# Patient Record
Sex: Male | Born: 1970 | Race: Black or African American | Hispanic: No | Marital: Single | Smoking: Never smoker
Health system: Southern US, Community
[De-identification: ages and names within clinical notes are randomized; demographics above are authoritative.]

## PROBLEM LIST (undated history)

## (undated) DIAGNOSIS — L309 Dermatitis, unspecified: Secondary | ICD-10-CM

## (undated) DIAGNOSIS — E559 Vitamin D deficiency, unspecified: Secondary | ICD-10-CM

## (undated) DIAGNOSIS — R7301 Impaired fasting glucose: Secondary | ICD-10-CM

## (undated) HISTORY — DX: Impaired fasting glucose: R73.01

## (undated) HISTORY — DX: Dermatitis, unspecified: L30.9

## (undated) HISTORY — DX: Vitamin D deficiency, unspecified: E55.9

---

## 2017-11-27 ENCOUNTER — Ambulatory Visit: Payer: BC Managed Care – PPO | Admitting: Medical

## 2017-11-27 ENCOUNTER — Encounter: Payer: Self-pay | Admitting: Medical

## 2017-11-27 VITALS — BP 130/80 | HR 80 | Temp 98.2°F | Resp 16 | Ht 68.0 in | Wt 200.0 lb

## 2017-11-27 DIAGNOSIS — Z2821 Immunization not carried out because of patient refusal: Secondary | ICD-10-CM

## 2017-11-27 DIAGNOSIS — G8929 Other chronic pain: Secondary | ICD-10-CM | POA: Diagnosis not present

## 2017-11-27 DIAGNOSIS — M25512 Pain in left shoulder: Secondary | ICD-10-CM | POA: Diagnosis not present

## 2017-11-27 NOTE — Patient Instructions (Addendum)
Shoulder pain  If you get a flareup of the shoulder pain in the near future then use conservative treatment for 5 to 7 days including the following  Using arm sling over-the-counter 1 to 2 hours at a time throughout the day  Resting the arm, not lifting anything over 15 pounds in the short-term  Using over-the-counter Aleve or ibuprofen for 4 to 5 days  Using ice such as ice water pack or bag of frozen peas, 20 minutes twice daily  I would recommend doing a daily stretching routine  I recommend using light weight such as a 5 pound dumbbell or water bottle to do shoulder exercises to strengthen the shoulder  If your pains continue the next step would be an x-ray  Check your insurance coverage about doing a colonoscopy for screening as well as checking for coverage for vaccine  I will see you back at your convenience for physical  At that time we would recommend a tetanus booster if you have not had one in the last 10 years  I also recommend a yearly flu shot    Rotator Cuff Tendinitis Rotator cuff tendinitis is inflammation of the tough, cord-like bands that connect muscle to bone (tendons) in the rotator cuff. The rotator cuff includes all of the muscles and tendons that connect the arm to the shoulder. The rotator cuff holds the head of the upper arm bone (humerus) in the cup (fossa) of the shoulder blade (scapula). This condition can lead to a long-lasting (chronic) tear. The tear may be partial or complete. What are the causes? This condition is usually caused by overusing the rotator cuff. What increases the risk? This condition is more likely to develop in athletes and workers who frequently use their shoulder or reach over their heads. This can include activities such as:  Tennis.  Baseball or softball.  Swimming.  Construction work.  Painting.  What are the signs or symptoms? Symptoms of this condition include:  Pain spreading (radiating) from the shoulder to  the upper arm.  Swelling and tenderness in front of the shoulder.  Pain when reaching, pulling, or lifting the arm above the head.  Pain when lowering the arm from above the head.  Minor pain in the shoulder when resting.  Increased pain in the shoulder at night.  Difficulty placing the arm behind the back.  How is this diagnosed? This condition is diagnosed with a medical history and physical exam. Tests may also be done, including:  X-rays.  MRI.  Ultrasounds.  CT or MR arthrogram. During this test, a contrast material is injected and then images are taken.  How is this treated? Treatment for this condition depends on the severity of the condition. In less severe cases, treatment may include:  Rest. This may be done with a sling that holds the shoulder still (immobilization). Your health care provider may also recommend avoiding activities that involve lifting your arm over your head.  Icing the shoulder.  Anti-inflammatory medicines, such as aspirin or ibuprofen.  In more severe cases, treatment may include:  Physical therapy.  Steroid injections.  Surgery.  Follow these instructions at home: If you have a sling:  Wear the sling as told by your health care provider. Remove it only as told by your health care provider.  Loosen the sling if your fingers tingle, become numb, or turn cold and blue.  Keep the sling clean.  If the sling is not waterproof, do not let it get wet. Remove it,  if allowed, or cover it with a watertight covering when you take a bath or shower. Managing pain, stiffness, and swelling  If directed, put ice on the injured area. ? If you have a removable sling, remove it as told by your health care provider. ? Put ice in a plastic bag. ? Place a towel between your skin and the bag. ? Leave the ice on for 20 minutes, 2-3 times a day.  Move your fingers often to avoid stiffness and to lessen swelling.  Raise (elevate) the injured area  above the level of your heart while you are lying down.  Find a comfortable sleeping position or sleep on a recliner, if available. Driving  Do not drive or use heavy machinery while taking prescription pain medicine.  Ask your health care provider when it is safe to drive if you have a sling on your arm. Activity  Rest your shoulder as told by your health care provider.  Return to your normal activities as told by your health care provider. Ask your health care provider what activities are safe for you.  Do any exercises or stretches as told by your health care provider.  If you do repetitive overhead tasks, take small breaks in between and include stretching exercises as told by your health care provider. General instructions  Do not use any products that contain nicotine or tobacco, such as cigarettes and e-cigarettes. These can delay healing. If you need help quitting, ask your health care provider.  Take over-the-counter and prescription medicines only as told by your health care provider.  Keep all follow-up visits as told by your health care provider. This is important. Contact a health care provider if:  Your pain gets worse.  You have new pain in your arm, hands, or fingers.  Your pain is not relieved with medicine or does not get better after 6 weeks of treatment.  You have cracking sensations when moving your shoulder in certain directions.  You hear a snapping sound after using your shoulder, followed by severe pain and weakness. Get help right away if:  Your arm, hand, or fingers are numb or tingling.  Your arm, hand, or fingers are swollen or painful or they turn white or blue. Summary  Rotator cuff tendinitis is inflammation of the tough, cord-like bands that connect muscle to bone (tendons) in the rotator cuff.  This condition is usually caused by overusing the rotator cuff, which includes all of the muscles and tendons that connect the arm to the  shoulder.  This condition is more likely to develop in athletes and workers who frequently use their shoulder or reach over their heads.  Treatment generally includes rest, anti-inflammatory medicines, and icing. In some cases, physical therapy and steroid injections may be needed. In severe cases, surgery may be needed. This information is not intended to replace advice given to you by your health care provider. Make sure you discuss any questions you have with your health care provider. Document Released: 05/31/2003 Document Revised: 02/25/2016 Document Reviewed: 02/25/2016 Elsevier Interactive Patient Education  2017 [employer].

## 2017-11-27 NOTE — Progress Notes (Signed)
Subjective: Chief Complaint  Patient presents with  . NP left shoulder pain    NP left shoulder pain, arm,    Here as a new patient to establish.   was seeing [facility] in [place].  Been in [place] 2 years.   Here for c/o left shoulder pain.  In summer left shoulder started flaring up more after he started exercising with walking and some weights.  Over time has gotten to be worse in recent weeks with throbbing all the way down left arm.  Was getting to be daily pain until this past week when it improved some.   Left handed.  No neck pain currently, but has had neck pains in 2013, was seeing chiropractor then.   No numbness in hand, but had some numbness in summer time.    He knows he needs to lose some weight  No other aggravating or relieving factors. No other complaint.   No past medical history on file. Current Outpatient Medications on File Prior to Visit  Medication Sig Dispense Refill  . acetaminophen (TYLENOL) 500 MG tablet Take 500 mg by mouth every 6 (six) hours as needed.    Marland Kitchen aspirin EC 81 MG tablet Take 81 mg by mouth daily.    Marland Kitchen ibuprofen (ADVIL,MOTRIN) 200 MG tablet Take 200 mg by mouth every 6 (six) hours as needed.     No current facility-administered medications on file prior to visit.    ROS as in subjective   Objective: BP 130/80   Pulse 80   Temp 98.2 F (36.8 C) (Oral)   Resp 16   Ht 5' 8"  (1.727 m)   Wt 200 lb (90.7 kg)   SpO2 97%   BMI 30.41 kg/m   Gen: wd, wn, ,nad, AA male Skin: unremarkable Neck nontender, no mass, supple, normal range of motion Left shoulder and arm and upper back nontender, normal range of motion of the shoulder but he does seem to have some mild discomfort with shoulder flexion over 90 degrees but no tenderness on palpation and nontender with passive range of motion, no laxity no pain with special test Arms neurovascularly intact Lungs clear RRR, normal S1 and S2 no murmurs   Assessment: Encounter  Diagnoses  Name Primary?  . Chronic left shoulder pain Yes  . Influenza vaccination declined      Plan: We discussed his symptoms and concerns however his shoulder pain resolved in the past week.  No major findings on exam today  Shoulder pain  If you get a flareup of the shoulder pain in the near future then use conservative treatment for 5 to 7 days including the following  Using arm sling over-the-counter 1 to 2 hours at a time throughout the day  Resting the arm, not lifting anything over 15 pounds in the short-term  Using over-the-counter Aleve or ibuprofen for 4 to 5 days  Using ice such as ice water pack or bag of frozen peas, 20 minutes twice daily  I would recommend doing a daily stretching routine  I recommend using light weight such as a 5 pound dumbbell or water bottle to do shoulder exercises to strengthen the shoulder  If your pains continue the next step would be an x-ray  Check your insurance coverage about doing a colonoscopy for screening as well as checking for coverage for vaccine  I will see you back at your convenience for physical  At that time we would recommend a tetanus booster if you have not  had one in the last 10 years  I also recommend a yearly flu shot  He declines a flu shot today  Jacory was seen today for np left shoulder pain.  Diagnoses and all orders for this visit:  Chronic left shoulder pain  Influenza vaccination declined

## 2018-12-24 ENCOUNTER — Ambulatory Visit: Payer: BC Managed Care – PPO | Admitting: Medical

## 2018-12-24 ENCOUNTER — Encounter: Payer: Self-pay | Admitting: Medical

## 2018-12-24 ENCOUNTER — Other Ambulatory Visit: Payer: Self-pay

## 2018-12-24 VITALS — BP 132/88 | HR 88 | Temp 98.0°F | Ht 69.0 in | Wt 214.6 lb

## 2018-12-24 DIAGNOSIS — M79644 Pain in right finger(s): Secondary | ICD-10-CM | POA: Insufficient documentation

## 2018-12-24 DIAGNOSIS — M65351 Trigger finger, right little finger: Secondary | ICD-10-CM

## 2018-12-24 DIAGNOSIS — M67442 Ganglion, left hand: Secondary | ICD-10-CM | POA: Diagnosis not present

## 2018-12-24 DIAGNOSIS — L309 Dermatitis, unspecified: Secondary | ICD-10-CM

## 2018-12-24 DIAGNOSIS — M79645 Pain in left finger(s): Secondary | ICD-10-CM | POA: Diagnosis not present

## 2018-12-24 DIAGNOSIS — Z23 Encounter for immunization: Secondary | ICD-10-CM | POA: Diagnosis not present

## 2018-12-24 MED ORDER — EUCRISA 2 % EX OINT: 1.0000 "application " | TOPICAL_OINTMENT | Freq: Every day | CUTANEOUS | 2 refills | Status: DC

## 2018-12-24 NOTE — Progress Notes (Signed)
Subjective:  Derek Cowan is a 48 y.o. male who presents for Chief Complaint  Patient presents with  . Hand Pain    biltaeral-right hand pinky left hand middle finger-middle fingreer has bump and pain radiates up left arm      Here for some pains in fingers.  He notes bump on left middle finger been there since January, but if he bumps the bump, has pain.  No change in ROM of left middle finger.   He also notes he can't fully bend right pinky and there is some pain with flexion.   This has been ongoing for weeks.  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  Mostly gets irritated skin, rough skin patches on face and arms.  No current flare up.  No other aggravating or relieving factors.    No other c/o.  The following portions of the patient's history were reviewed and updated as appropriate: allergies, current medications, past family history, past medical history, past social history, past surgical history and problem list.  ROS Otherwise as in subjective above  Objective: BP 132/88   Pulse 88   Temp 98 F (36.7 C)   Ht 5' 9"  (1.753 m)   Wt 214 lb 9.6 oz (97.3 kg)   SpO2 98%   BMI 31.69 kg/m   General appearance: alert, no distress, well developed, well nourished No current skin irritation or abnormality of face or arms Left 3rd finger mid phalanx with raised somewhat oblong mobile cystic mass subcutaneous that causes some discomfort with pressure and manipulation, otherwise finger normal appearing, otherwise nontender and normal ROM.    Right 5 finger nontender other than mildly over 5th MCP volar side distally, unable to completely flex the 5th finger, otherwise hands and arms normal exam.  Arms neurovascularly intact Pulses: 2+ radial pulses, 2+ pedal pulses, normal cap refill Ext: no edema   Assessment: Encounter Diagnoses  Name Primary?  . Pain in finger of both hands Yes  . Ganglion cyst of finger of left hand   . Trigger little  finger of right hand   . Need for influenza vaccination   . Eczema, unspecified type      Plan: We discussed his symptoms and concerns and exam findings.  We discussed differential diagnosis.  I suspect his left middle finger lesion is either epidermal inclusion cyst or ganglion cyst of the finger.  We discussed his right small finger symptoms which may be mild trigger finger issue/nodule pushing on flexor tendon.  We will refer to orthopedics for further eval and treatment options.  He is bothered by both issues regularly, plays piano and would like some treatment options for these.  Counseled on the influenza virus vaccine.  Vaccine information sheet given.  Influenza vaccine given after consent obtained.  Eczema - continue Eucrisa daily, avoid allergy triggers if possible   Derek Cowan was seen today for hand pain.  Diagnoses and all orders for this visit:  Pain in finger of both hands -     Ambulatory referral to Orthopedic Surgery  Ganglion cyst of finger of left hand -     Ambulatory referral to Orthopedic Surgery  Trigger little finger of right hand -     Ambulatory referral to Orthopedic Surgery  Need for influenza vaccination  Eczema, unspecified type  Other orders -     Crisaborole (EUCRISA) 2 % OINT; Apply 1 application topically daily.    Follow up: pending referral

## 2018-12-25 ENCOUNTER — Telehealth: Payer: Self-pay | Admitting: Medical

## 2018-12-25 NOTE — Telephone Encounter (Signed)
Refer to either ortho or hand surgery center   RE: Left finger either epidermal inclusion cyst vs ganglion cyst of finger Right hand possible trigger finger

## 2018-12-26 ENCOUNTER — Telehealth: Payer: Self-pay | Admitting: Medical

## 2018-12-26 NOTE — Telephone Encounter (Signed)
P.A. EUCRISA

## 2018-12-27 NOTE — Telephone Encounter (Signed)
Patient has been referred.

## 2018-12-30 ENCOUNTER — Encounter: Payer: Self-pay | Admitting: Family Medicine

## 2018-12-30 ENCOUNTER — Other Ambulatory Visit: Payer: Self-pay

## 2018-12-30 ENCOUNTER — Ambulatory Visit (INDEPENDENT_AMBULATORY_CARE_PROVIDER_SITE_OTHER): Payer: BC Managed Care – PPO | Admitting: Family Medicine

## 2018-12-30 ENCOUNTER — Ambulatory Visit: Payer: Self-pay

## 2018-12-30 DIAGNOSIS — R2232 Localized swelling, mass and lump, left upper limb: Secondary | ICD-10-CM | POA: Diagnosis not present

## 2018-12-30 DIAGNOSIS — M65351 Trigger finger, right little finger: Secondary | ICD-10-CM | POA: Diagnosis not present

## 2018-12-30 NOTE — Progress Notes (Signed)
Office Visit Note   Patient: Derek Cowan           Date of Birth: Jul 06, 1970           MRN: [id number] Visit Date: 12/30/2018 Requested by: Carlena Hurl, PA-C [address] [place],  [place] [address] PCP: Carlena Hurl, PA-C  Subjective: Chief Complaint  Patient presents with  . Right Little Finger - Follow-up  . Left Middle Finger - Pain  . Right Hand - Pain  . Left Hand - Pain    HPI: He is here with right fifth finger and left third finger pain.  His left third finger developed a nodule around February of this year.  He does not recall a specific injury but he noticed a bump on the dorsal/ulnar side and it has not improved with time.  In fact, sometimes he feels pain shooting from it up the arm toward the elbow.  It does not affect his ability to bend his finger.  It is uncomfortable when something bumps against it.  In the past 3 or 4 weeks he has had triggering of his right fifth finger.  Again, no injury.  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                ROS: No fevers or chills.  No diabetes, thyroid dysfunction, etc.  All other systems were reviewed and are negative.  Objective: Vital Signs: There were no vitals taken for this visit.  Physical Exam:  General:  Alert and oriented, in no acute distress. Pulm:  Breathing unlabored. Psy:  Normal mood, congruent affect. Skin: No erythema or rash. Right hand: His fifth finger has a tender nodule at A1 pulley.  He has full active and passive range of motion of the finger, no triggering this morning.  No effusion in the PIP joint, no tenderness to palpation around the PIP joint. Left hand: His third finger has a nodule on the dorsal ulnar side just distal to the DIP joint.  It is roughly 1/2 cm diameter, it moves freely and is not adhered to the bone but it is slightly firm to the touch.   Imaging: Musculoskeletal  ultrasound of fingers: His left third finger nodule is not completely hypoechoic, in fact it is mixed echogenicity.  There is slight increased flow with power Doppler imaging.  It does not seem to be arising from the joint.  Is right fifth finger A1 pulley is slightly thickened.  The flexor tendon appears normal as does the PIP joint of the finger.   Assessment & Plan: 1.  Right fifth trigger finger -Discussed various options with patient and he would like to try an injection.  If this does not help, then hand therapy with iontophoresis.  2.  Left third finger nodule -Etiology is uncertain.  It does not look exactly like a ganglion cyst, seems to be more of a solid nodule.  We will order an MRI scan to further evaluate.  If MRI shows a ganglion cyst, then we could aspirate and inject.     Procedures: Right fifth trigger finger injection: After sterile prep Betadine, injected 1/2 cc 1% lidocaine without epinephrine and 20 mg methylprednisolone into the region of the A1 pulley.    PMFS History: Patient Active Problem List   Diagnosis Date Noted  . Pain in finger of both hands 12/24/2018  . Ganglion cyst of  finger of left hand 12/24/2018  . Trigger little finger of right hand 12/24/2018  . Need for influenza vaccination 12/24/2018  . Eczema 12/24/2018  . Chronic left shoulder pain 11/27/2017  . Influenza vaccination declined 11/27/2017   History reviewed. No pertinent past medical history.  History reviewed. No pertinent family history.  History reviewed. No pertinent surgical history. Social History   Occupational History  . Not on file  Tobacco Use  . Smoking status: Never Smoker  . Smokeless tobacco: Never Used  Substance and Sexual Activity  . Alcohol use: Not on file  . Drug use: Not on file  . Sexual activity: Not on file

## 2018-12-30 NOTE — Progress Notes (Signed)
Right pinky finger trigger finger, left middle finger-possible ganglion. Bilateral hand pain-?CTS?

## 2019-01-08 NOTE — Telephone Encounter (Signed)
P.A. approved til 12/26/19, pt informed, called pharmacy

## 2019-01-27 ENCOUNTER — Other Ambulatory Visit: Payer: Self-pay

## 2019-01-27 ENCOUNTER — Ambulatory Visit
Admission: RE | Admit: 2019-01-27 | Discharge: 2019-01-27 | Disposition: A | Discharge status: Home / Self Care | Payer: BC Managed Care – PPO | Source: Ambulatory Visit | Attending: Family Medicine | Admitting: Family Medicine

## 2019-01-27 DIAGNOSIS — R2232 Localized swelling, mass and lump, left upper limb: Secondary | ICD-10-CM

## 2019-01-27 MED ORDER — GADOBENATE DIMEGLUMINE 529 MG/ML IV SOLN
20.0000 mL | Freq: Once | INTRAVENOUS | Status: AC | PRN
  Administered 2019-01-27: 20 mL via INTRAVENOUS

## 2019-01-28 ENCOUNTER — Telehealth: Payer: Self-pay | Admitting: Family Medicine

## 2019-01-28 DIAGNOSIS — R2232 Localized swelling, mass and lump, left upper limb: Secondary | ICD-10-CM

## 2019-01-28 NOTE — Telephone Encounter (Signed)
Finger MRI shows a solid mass.  Not sure exactly what it is, so I would like him to see Dr. Amedeo Plenty for further evaluation.  Will request referral.

## 2019-01-28 NOTE — Telephone Encounter (Signed)
Pt called in returning dr.hilts call please give him a call back when you can   [phone]

## 2019-01-28 NOTE — Telephone Encounter (Signed)
Patient has returned your call - asking for a call back.

## 2019-01-31 NOTE — Telephone Encounter (Signed)
I spoke with the patient, advising him of his MRI results and referral to Dr. Amedeo Plenty. He will await phone call from [facility] regarding an appointment.

## 2019-03-25 HISTORY — PX: HAND SURGERY: SHX662

## 2019-09-28 ENCOUNTER — Ambulatory Visit: Payer: BC Managed Care – PPO | Admitting: Medical

## 2019-09-28 ENCOUNTER — Other Ambulatory Visit: Payer: Self-pay

## 2019-09-28 ENCOUNTER — Encounter: Payer: Self-pay | Admitting: Medical

## 2019-09-28 VITALS — BP 136/80 | HR 80 | Ht 69.0 in | Wt 206.6 lb

## 2019-09-28 DIAGNOSIS — R002 Palpitations: Secondary | ICD-10-CM

## 2019-09-28 DIAGNOSIS — R0789 Other chest pain: Secondary | ICD-10-CM

## 2019-09-28 DIAGNOSIS — R519 Headache, unspecified: Secondary | ICD-10-CM | POA: Diagnosis not present

## 2019-09-28 DIAGNOSIS — Z6830 Body mass index (BMI) 30.0-30.9, adult: Secondary | ICD-10-CM

## 2019-09-28 DIAGNOSIS — M79602 Pain in left arm: Secondary | ICD-10-CM | POA: Diagnosis not present

## 2019-09-28 DIAGNOSIS — G8929 Other chronic pain: Secondary | ICD-10-CM | POA: Insufficient documentation

## 2019-09-28 DIAGNOSIS — R0683 Snoring: Secondary | ICD-10-CM

## 2019-09-28 NOTE — Progress Notes (Signed)
Subjective: Chief Complaint  Patient presents with  . Chest Pain    left arm pain last night-denies pain today    Here for left arm pain, chest discomfort.  He gets an unusual twinge of pain in his left upper arm occasionally.  No pain with activity, no decreased range of motion, no swelling.  No recent fall injury or trauma.  Gets this from time to time.  1 thing that brought him in today was that yesterday he had some palpitations in the chest and a chest discomfort.  He does not have associated nausea, sweats, SOB, dizziness, or jaw or shoulder pain.  He denies heavy caffeine use.  He does note some stress though.  These pains in his chest lasted for hours yesterday up until 3 AM.  He ended up using a combination of Pepto-Bismol, Prilosec and apple cider vinegar and the pain finally resolved early in the morning yesterday  He denies eating heavily acidic or spicy foods recently.  He felt like this pain was different than acid reflux symptoms he has had in the past  He lives alone.  He notes a long history of snoring but no witnessed apnea.  Sometimes has fatigue, sometimes daytime somnolence.   His father is a very loud snore and he thinks his father has sleep apnea but he has never had a study  He notes chronic headaches since her teenage years.  Sometimes gets nauseous, sometimes certain smells can trigger the headaches.  No formal diagnosis.  He uses over-the-counter Excedrin fairly often.  Headaches are several times per week chronically  His blood pressure is elevated but no prior history of hypertension.  No prior medication for blood pressure.    No family history of heart disease or sleep apnea.  Sister and aunt on bp meds   No past medical history on file.  Current Outpatient Medications on File Prior to Visit  Medication Sig Dispense Refill  . acetaminophen (TYLENOL) 500 MG tablet Take 500 mg by mouth every 6 (six) hours as needed. (Patient not taking: Reported on 09/28/2019)     . aspirin EC 81 MG tablet Take 81 mg by mouth daily. (Patient not taking: Reported on 09/28/2019)    . Crisaborole (EUCRISA) 2 % OINT Apply 1 application topically daily. (Patient not taking: Reported on 09/28/2019) 60 g 2  . ibuprofen (ADVIL,MOTRIN) 200 MG tablet Take 200 mg by mouth every 6 (six) hours as needed. (Patient not taking: Reported on 09/28/2019)    . ranitidine (ZANTAC) 150 MG tablet ranitidine 150 mg tablet  Take 1 tablet twice a day by oral route for 30 days. (Patient not taking: Reported on 09/28/2019)     No current facility-administered medications on file prior to visit.    No family history on file.   ROS as in subjective   Objective: BP 136/80   Pulse 80   Ht 5' 9"  (1.753 m)   Wt 206 lb 9.6 oz (93.7 kg)   SpO2 96%   BMI 30.51 kg/m   BP: 136/80  BP Readings from Last 3 Encounters:  09/28/19 136/80  12/24/18 132/88  11/27/17 130/80     General appearence: alert, no distress, WD/WN, African-American male HEENT: normocephalic, sclerae anicteric, PERRLA, EOMi, nares patent, no discharge or erythema, pharynx normal neck: supple, no lymphadenopathy, no thyromegaly, no masses Heart: RRR, normal S1, S2, no murmurs Lungs: CTA bilaterally, no wheezes, rhonchi, or rales Abdomen: +bs, soft, non tender, non distended, no masses, no hepatomegaly, no  splenomegaly Back: non tender Musculoskeletal: nontender, no swelling, no obvious deformity Extremities: no edema, no cyanosis, no clubbing Pulses: 2+ symmetric, upper and lower extremities, normal cap refill Neurological: alert, oriented x 3, CN2-12 intact, strength normal upper extremities and lower extremities, sensation normal throughout, DTRs 2+ throughout, no cerebellar signs, gait normal Psychiatric: normal affect, behavior normal, pleasant   EKG: Indication chest discomfort Rate 73 bpm, PR 144 ms, QRS 80 ms, QTC 420 ms, axis 60 degrees normal sinus rhythm, there was significant baseline interference.  We had to  repeat EKG several times.  The battery had run out and we had reviewed EKG machine.  After the EKG was reviewed and powered up some of the baseline artifact resolved.  So I do not completely trust the results of the EKG but no obvious ST elevation or acute change there may be some atrial enlargement given the P wave   Assessment: Encounter Diagnoses  Name Primary?  . Chest discomfort Yes  . Palpitation   . Left arm pain   . Chronic nonintractable headache, unspecified headache type   . Snoring   . BMI 30.0-30.9,adult      Plan: Chest discomfort, palpitations-we reviewed his EKG.  I believe there was some EKG baseline interference with our machine today.   No obvious worrisome findings.  Unclear etiology but possibly GERD, possibly stress.  He will return for fasting physical and labs in the near future  He does snore and has some symptoms of sleep apnea.  I encouraged him to do a sleep study.  He will let me know if agreeable  We discussed not sleeping flat supine, consider raising head of bed, work on efforts to lose weight through healthy diet and exercise  Left arm pain, intermittent, normal exam today  Chronic headaches, snoring, BMI greater than 30, consider sleep apnea diagnosis.  Again he will return soon for fasting labs, physical, and other evaluation for chronic headaches   Kannen was seen today for chest pain.  Diagnoses and all orders for this visit:  Chest discomfort -     EKG 12-Lead  Palpitation -     EKG 12-Lead  Left arm pain  Chronic nonintractable headache, unspecified headache type  Snoring  BMI 30.0-30.9,adult  f/u soon for fasting physical

## 2019-09-29 ENCOUNTER — Encounter: Payer: Self-pay | Admitting: Medical

## 2019-10-20 ENCOUNTER — Encounter: Payer: Self-pay | Admitting: Medical

## 2019-10-27 ENCOUNTER — Encounter: Payer: BC Managed Care – PPO | Admitting: Medical

## 2019-11-18 ENCOUNTER — Ambulatory Visit: Payer: BC Managed Care – PPO | Admitting: Medical

## 2019-11-18 ENCOUNTER — Encounter: Payer: Self-pay | Admitting: Medical

## 2019-11-18 ENCOUNTER — Other Ambulatory Visit: Payer: Self-pay

## 2019-11-18 VITALS — BP 124/86 | HR 93 | Ht 69.0 in | Wt 204.0 lb

## 2019-11-18 DIAGNOSIS — Z125 Encounter for screening for malignant neoplasm of prostate: Secondary | ICD-10-CM

## 2019-11-18 DIAGNOSIS — Z0000 Encounter for general adult medical examination without abnormal findings: Secondary | ICD-10-CM | POA: Diagnosis not present

## 2019-11-18 DIAGNOSIS — Z113 Encounter for screening for infections with a predominantly sexual mode of transmission: Secondary | ICD-10-CM

## 2019-11-18 DIAGNOSIS — Z23 Encounter for immunization: Secondary | ICD-10-CM | POA: Diagnosis not present

## 2019-11-18 DIAGNOSIS — Z1211 Encounter for screening for malignant neoplasm of colon: Secondary | ICD-10-CM | POA: Insufficient documentation

## 2019-11-18 DIAGNOSIS — Z13220 Encounter for screening for lipoid disorders: Secondary | ICD-10-CM | POA: Diagnosis not present

## 2019-11-18 NOTE — Patient Instructions (Signed)
Preventative Care for Adults - Male    Thank you for coming in for your well visit today, and thank you for trusting [place] with your care!  If you had a good experience today, please complete the surveys sent by [facility] and consider a review online such as Google or [employer] Grades, refer [place] to a friend   Maintain regular health and wellness exams:  A routine yearly physical is a good way to check in with your primary care provider about your health and preventive screening. It is also an opportunity to share updates about your health and any concerns you have, and receive a thorough all-over exam.   Most health insurance companies pay for at least some preventative services.  Check with your health plan for specific coverages.  What preventative services do men need?  Adult men should have their weight and blood pressure checked regularly.   Men age 23 and older should have their cholesterol levels checked regularly.  Beginning at age 23 and continuing to age 9, men should be screened for colorectal cancer.  Certain people may need continued testing until age 15.  Updating vaccinations is part of preventative care.  Vaccinations help protect against diseases such as the flu.  Osteoporosis is a disease in which the bones lose minerals and strength as we age. Men ages 47 and over should discuss this with their caregivers  Lab tests are generally done as part of preventative care to screen for anemia and blood disorders, to screen for problems with the kidneys and liver, to screen for bladder problems, to check blood sugar, and to check your cholesterol level.  Preventative services generally include counseling about diet, exercise, avoiding tobacco, drugs, excessive alcohol consumption, and sexually transmitted infections.   Xrays and CT scans are not normally done as a preventative test, and most insurances do not pay for imaging for screening other than as discussed under cancer screens  below.   On the other hand, if you have certain medical concerns, imaging may be necessary as a diagnostic test.    Your Medical Team Your medical team starts with [place], your PCP or primary care provider.  Please use our services for your routine care such as physicals, screenings, immunizations, sick visits, and your first stop for general medical concerns.  You can call our number for after hours information for urgent questions that may need attention but cannot wait til the next business day.    Urgent care-urgent cares exist to provide care when your primary care office would typically be closed such as evenings or weekends.   Urgent care is for evaluation of urgent medical problems that do not necessarily require emergency department care, but cannot wait til the next business day when we are open.  Emergency department care-please reserve emergency department care for serious, urgent, possibly life-threatening medical problems.  This includes issues like possible stroke, heart attack, significant injury, mental health crisis, or other urgent need that requires immediate medical attention.     See your dentist office twice yearly for hygiene and cleaning visits.   Brush your teeth and floss your teeth daily.  See your eye doctor yearly for routine eye exam and screenings for glaucoma and retinal disease.    Vaccines:  Stay up to date with your tetanus shots and other required immunizations. You should have a booster for tetanus every 10 years. Be sure to get your flu shot every year, since 5%-20% of the [place] population comes down  with the flu. The flu vaccine changes each year, so being vaccinated once is not enough. Get your shot in the fall, before the flu season peaks.   Other vaccines to consider:  Pneumococcal vaccine to protect against certain types of pneumonia.  This is normally recommended for adults age 47 or older.  However, adults younger than 47 years old with certain  underlying conditions such as diabetes, heart or lung disease should also receive the vaccine.  Shingles vaccine to protect against Varicella Zoster if you are older than age 47, or younger than 47 years old with certain underlying illness.  If you have not had the Shingrix vaccine, please call your insurer to inquire about coverage for the Shingrix vaccine given in 2 doses.   Some insurers cover this vaccine after age 58, some cover this after age 47.  If your insurer covers this, then call to schedule appointment to have this vaccine here  Hepatitis A vaccine to protect against a form of infection of the liver by a virus acquired from food.  Hepatitis B vaccine to protect against a form of infection of the liver by a virus acquired from blood or body fluids, particularly if you work in health care.  If you plan to travel internationally, check with your local health department for specific vaccination recommendations.  Human Papilloma Virus or HPV causes cancer of the cervix, and other infections that can be transmitted from person to person. There is a vaccine for HPV, and males should get immunized between the ages of 47 and 66. It requires a series of 3 shots.   Covid/Coronavirus - Please consider vaccination for your benefit and to help prevent spread of Covid to those around you.       What should I know about Cancer screening? Many types of cancers can be detected early and may often be prevented. Lung Cancer  You should be screened every year for lung cancer if: ? You are a current smoker who has smoked for at least 30 years. ? You are a former smoker who has quit within the past 15 years.  Talk to your health care provider about your screening options, when you should start screening, and how often you should be screened.  Colorectal Cancer  Routine colorectal cancer screening usually begins at 47 years of age and should be repeated every 5-10 years until you are 47 years old.  You may need to be screened more often if early forms of precancerous polyps or small growths are found. Your health care provider may recommend screening at an earlier age if you have risk factors for colon cancer.  Your health care provider may recommend using home test kits to check for hidden blood in the stool.  A small camera at the end of a tube can be used to examine your colon (sigmoidoscopy or colonoscopy). This checks for the earliest forms of colorectal cancer.  Prostate and Testicular Cancer  Depending on your age and overall health, your health care provider may do certain tests to screen for prostate and testicular cancer.  Talk to your health care provider about any symptoms or concerns you have about testicular or prostate cancer.  Skin Cancer  Check your skin from head to toe regularly.  Tell your health care provider about any new moles or changes in moles, especially if: ? There is a change in a mole's size, shape, or color. ? You have a mole that is larger than a pencil eraser.  Always use sunscreen. Apply sunscreen liberally and repeat throughout the day.  Protect yourself by wearing long sleeves, pants, a wide-brimmed hat, and sunglasses when outside.     GENERAL RECOMMENDATIONS FOR GOOD HEALTH:  Healthy diet:  Eat a variety of foods, including fruit, vegetables, animal or vegetable protein, such as meat, fish, chicken, and eggs, or beans, lentils, tofu, and grains, such as rice.  Drink plenty of water daily.  Decrease saturated fat in the diet, avoid lots of red meat, processed foods, sweets, fast foods, and fried foods.  Exercise:  Aerobic exercise helps maintain good heart health. At least 30-40 minutes of moderate-intensity exercise is recommended. For example, a brisk walk that increases your heart rate and breathing. This should be done on most days of the week.   Find a type of exercise or a variety of exercises that you enjoy so that it becomes a  part of your daily life.  Examples are running, walking, swimming, water aerobics, and biking.  For motivation and support, explore group exercise such as aerobic class, spin class, Zumba, Yoga,or  martial arts, etc.    Set exercise goals for yourself, such as a certain weight goal, walk or run in a race such as a 5k walk/run.  Speak to your primary care provider about exercise goals.  Your weight readings per our records: Wt Readings from Last 3 Encounters:  11/18/19 204 lb (92.5 kg)  09/28/19 206 lb 9.6 oz (93.7 kg)  12/24/18 214 lb 9.6 oz (97.3 kg)    Body mass index is 30.13 kg/m.    Disease prevention:  If you smoke or chew tobacco, find out from your caregiver how to quit. It can literally save your life, no matter how long you have been a tobacco user. If you do not use tobacco, never begin.   Maintain a healthy diet and normal weight. Increased weight leads to problems with blood pressure and diabetes.   The Body Mass Index or BMI is a way of measuring how much of your body is fat. Having a BMI above 27 increases the risk of heart disease, diabetes, hypertension, stroke and other problems related to obesity. Your caregiver can help determine your BMI and based on it develop an exercise and dietary program to help you achieve or maintain this important measurement at a healthful level.  High blood pressure causes heart and blood vessel problems.  Persistent high blood pressure should be treated with medicine if weight loss and exercise do not work.  Your blood pressure readings per our records:     BP Readings from Last 3 Encounters:  11/18/19 124/86  09/28/19 136/80  12/24/18 132/88     Fat and cholesterol leaves deposits in your arteries that can block them. This causes heart disease and vessel disease elsewhere in your body.  If your cholesterol is found to be high, or if you have heart disease or certain other medical conditions, then you may need to have your cholesterol  monitored frequently and be treated with medication.   Ask if you should have a cardiac stress test if your history suggests this. A stress test is a test done on a treadmill that looks for heart disease. This test can find disease prior to there being a problem.   Osteoporosis is a disease in which the bones lose minerals and strength as we age. This can result in serious bone fractures. Risk of osteoporosis can be identified using a bone density scan. Men ages 41  and over should discuss this with their caregivers. Ask your caregiver whether you should be taking a calcium supplement and Vitamin D, to reduce the rate of osteoporosis.   Avoid drinking alcohol in excess (more than two drinks per day).  Avoid use of street drugs. Do not share needles with anyone. Ask for professional help if you need assistance or instructions on stopping the use of alcohol, cigarettes, and/or drugs.  Brush your teeth twice a day with fluoride toothpaste, and floss once a day. Good oral hygiene prevents tooth decay and gum disease. The problems can be painful, unattractive, and can cause other health problems. Visit your dentist for a routine oral and dental check up and preventive care every 6-12 months.      Spiritual and Emotional Health Keeping a healthy spiritual life can help you better manage your physical health. Your spiritual life can help you to cope with any issues that may arise with your physical health.  Balance can keep [place] healthy and help [place] to recover.  If you are struggling with your spiritual health there are questions that you may want to ask yourself:  What makes me feel most complete? When do I feel most connected to the rest of the world? Where do I find the most inner strength? What am I doing when I feel whole?  Helpful tips: . Being in nature. Some people feel very connected and at peace when they are walking outdoors or are outside. Marland Kitchen Helping others. Some feel the largest sense of  wellbeing when they are of service to others. Being of service can take on many forms. It can be doing volunteer work, being kind to strangers, or offering a hand to a friend in need. . Gratitude. Some people find they feel the most connected when they remain grateful. They may make lists of all the things they are grateful for or say a thank you out loud for all they have.    Emotional Health Are you in tune with your emotional health?  Check out this link: http://www.bray.com/    Legal  Take the time to do a last will and testament, Advanced Directives including [place] and Living Will documents.  Don't leave your family with burdens that can be handled ahead of time.   Financial Health . Make sure you use a budget for your personal finances . Make sure you are insured against risks (health insurance, life insurance, auto insurance, etc) . Save more, spend less . Set financial goals . If you need help in this area, good resources include counseling through [employer] or other community resources, have a meeting with a Emergency planning/management officer, and a good resource is [employer] 10 reasons people come to the doctor's office:   (what is your "ounce of prevention")  Skin disorders; Osteoarthritis and joint disorders; Back problems; Cholesterol problems; Upper respiratory conditions, excluding asthma; Anxiety, depression, and bipolar disorder; Chronic neurologic disorders; High blood pressure; Headaches and migraines; and Diabetes.      Safety:  Use seatbelts 100% of the time, whether driving or as a passenger.  Use safety devices such as hearing protection if you work in environments with loud noise or significant background noise.  Use safety glasses when doing any work that could send debris in to the eyes.  Use a helmet if you ride a bike or motorcycle.  Use appropriate safety gear for contact sports.  Talk to  your  caregiver about gun safety.  Use sunscreen with a SPF (or skin protection factor) of 15 or greater.  Lighter skinned people are at a greater risk of skin cancer. Don't forget to also wear sunglasses in order to protect your eyes from too much damaging sunlight. Damaging sunlight can accelerate cataract formation.   Keep carbon monoxide and smoke detectors in your home functioning at all times. Change the batteries every 6 months or use a model that plugs into the wall.    Sexual activity: . Sex is a normal part of life and sexual activity can continue into older adulthood for many healthy people.   . If you are having erectile dysfunction issues, please follow up to discuss this further.   . If you are not in a monogamous relationship or have more than one partner, please practice safe sex.  Use condoms. Condoms are used for birth control and to help reduce the spread of sexually transmitted infections (or STIs).  Some of the STIs are gonorrhea (the clap), chlamydia, syphilis, trichomonas, herpes, HPV (human papilloma virus) and HIV (human immunodeficiency virus) which causes AIDS. The herpes, HIV and HPV are viral illnesses that have no cure. These can result in disability, cancer and death.   We are able to test for STIs here at our office.

## 2019-11-18 NOTE — Progress Notes (Signed)
Subjective:   HPI  Derek Cowan is a 49 y.o. male who presents for Chief Complaint  Patient presents with  . 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  Plans to establish with eye doctor  Reviewed their medical, surgical, family, social, medication, and allergy history and updated chart as appropriate.  Past Medical History:  Diagnosis Date  . Eczema     Past Surgical History:  Procedure Laterality Date  . HAND SURGERY  2021   tumor of left hand, benign    Family History  Problem Relation Age of Onset  . Lupus Mother   . Lung disease Mother        ARDS  . Hypertension Mother   . Prostatitis Father   . Arrhythmia Sister   . Miscarriages / Stillbirths Sister   . Hypertension Sister   . Heart disease Paternal Aunt   . Cancer Paternal Grandfather        lung  . Stroke Neg Hx     No current outpatient medications on file.  Allergies  Allergen Reactions  . Lactose Intolerance (Gi)     Review of Systems Constitutional: -fever, -chills, -sweats, -unexpected weight change, -decreased appetite, -fatigue Allergy: -sneezing, -itching, -congestion Dermatology: -changing moles, --rash, -lumps ENT: -runny nose, -ear pain, -sore throat, -hoarseness, -sinus pain, -teeth pain, - ringing in ears, -hearing loss, -nosebleeds Cardiology: -chest pain, -palpitations, -swelling, -difficulty breathing when lying flat, -waking up short of breath Respiratory: -cough, -shortness of breath, -difficulty breathing with exercise or exertion, -wheezing, -coughing up blood Gastroenterology: -abdominal pain, -nausea, -vomiting, -diarrhea, -constipation, -blood in stool, -changes in bowel movement, -difficulty swallowing or eating Hematology: -bleeding, -bruising  Musculoskeletal: -joint aches, -muscle aches, -joint swelling,  -back pain, -neck pain, -cramping, -changes in gait Ophthalmology: denies vision changes, eye redness, itching, discharge Urology: -burning with urination, -difficulty urinating, -blood in urine, -urinary frequency, -urgency, -incontinence Neurology: -headache, -weakness, -tingling, -numbness, -memory loss, -falls, -dizziness Psychology: -depressed mood, -agitation, -sleep problems Male GU: no testicular mass, pain, no lymph nodes swollen, no swelling, no rash.     Objective:  BP 124/86   Pulse 93   Ht 5' 9"  (1.753 m)   Wt 204 lb (92.5 kg)   SpO2 97%   BMI 30.13 kg/m   General appearance: alert, no distress, WD/WN, African American male Skin: unremarkable Neck: supple, no lymphadenopathy, no thyromegaly, no masses, normal ROM, no bruits Chest: non tender, normal shape and expansion Heart: RRR, normal S1, S2, no murmurs Lungs: CTA bilaterally, no wheezes, rhonchi, or rales Abdomen: +bs, soft, non tender, non distended, no masses, no hepatomegaly, no splenomegaly, no bruits Back: non tender, normal ROM, no scoliosis Musculoskeletal: upper extremities non tender, no obvious deformity, normal ROM throughout, lower extremities non tender, no obvious deformity, normal ROM throughout Extremities: no edema, no cyanosis, no clubbing Pulses: 2+ symmetric, upper and lower extremities, normal cap refill Neurological: alert, oriented x 3, CN2-12 intact, strength normal upper extremities and lower extremities, sensation normal throughout, DTRs 2+ throughout, no cerebellar signs, gait normal Psychiatric: normal affect, behavior normal, pleasant  GU: normal male external genitalia,circumcised, nontender, no masses, no hernia, no lymphadenopathy Rectal: anus normal tone, prostate mildly enlarged , no nodules   Assessment and Plan :   Encounter Diagnoses  Name Primary?  . Encounter for health maintenance examination in  adult Yes  . Need for Tdap vaccination   . Need for influenza vaccination    . Screening for lipid disorders   . Screening for prostate cancer   . Screen for colon cancer   . Screen for STD (sexually transmitted disease)     Physical exam - discussed and counseled on healthy lifestyle, diet, exercise, preventative care, vaccinations, sick and well care, proper use of emergency dept and after hours care, and addressed their concerns.    Health screening: See your eye doctor yearly for routine vision care. See your dentist yearly for routine dental care including hygiene visits twice yearly.  Discussed STD testing, discussed prevention, condom use, means of transmission  Cancer screening Colonoscopy:  Advised he check insurance for coverage for colonoscopy screening  Discussed PSA, prostate exam, and prostate cancer screening risks/benefits.      Vaccinations: Counseled on the influenza virus vaccine.  Vaccine information sheet given.  Influenza vaccine given after consent obtained.  Counseled on the Tdap (tetanus, diptheria, and acellular pertussis) vaccine.  Vaccine information sheet given. Tdap vaccine given after consent obtained.  He notes covid vaccine series in spring     Derek Cowan was seen today for annual exam.  Diagnoses and all orders for this visit:  Encounter for health maintenance examination in adult -     Comprehensive metabolic panel -     CBC with Differential/Platelet -     PSA -     Lipid panel -     HIV Antibody (routine testing w rflx) -     RPR -     GC/Chlamydia Probe Amp -     Hepatitis C antibody -     Hepatitis B surface antigen  Need for Tdap vaccination  Need for influenza vaccination  Screening for lipid disorders -     Lipid panel  Screening for prostate cancer -     PSA  Screen for colon cancer  Screen for STD (sexually transmitted disease) -     HIV Antibody (routine testing w rflx) -     RPR -     GC/Chlamydia Probe Amp -     Hepatitis C antibody -     Hepatitis B surface antigen  Other orders -      Flu Vaccine QUAD 6+ mos PF IM (Fluarix Quad PF) -     Tdap vaccine greater than or equal to 7yo IM    Follow-up pending labs, yearly for physical

## 2019-11-19 LAB — COMPREHENSIVE METABOLIC PANEL
ALT: 14 IU/L (ref 0–44)
AST: 15 IU/L (ref 0–40)
Albumin/Globulin Ratio: 1.6 (ref 1.2–2.2)
Albumin: 4.9 g/dL (ref 4.0–5.0)
Alkaline Phosphatase: 133 IU/L — ABNORMAL HIGH (ref 48–121)
BUN/Creatinine Ratio: 10 (ref 9–20)
BUN: 14 mg/dL (ref 6–24)
Bilirubin Total: 0.8 mg/dL (ref 0.0–1.2)
CO2: 24 mmol/L (ref 20–29)
Calcium: 10 mg/dL (ref 8.7–10.2)
Chloride: 99 mmol/L (ref 96–106)
Creatinine, Ser: 1.39 mg/dL — ABNORMAL HIGH (ref 0.76–1.27)
GFR calc Af Amer: 69 mL/min/{1.73_m2} (ref >59)
GFR calc non Af Amer: 60 mL/min/{1.73_m2} (ref >59)
Globulin, Total: 3 g/dL (ref 1.5–4.5)
Glucose: 106 mg/dL — ABNORMAL HIGH (ref 65–99)
Potassium: 4.1 mmol/L (ref 3.5–5.2)
Sodium: 139 mmol/L (ref 134–144)
Total Protein: 7.9 g/dL (ref 6.0–8.5)

## 2019-11-19 LAB — CBC WITH DIFFERENTIAL/PLATELET
Basophils Absolute: 0.1 10*3/uL (ref 0.0–0.2)
Basos: 1 %
EOS (ABSOLUTE): 0.1 10*3/uL (ref 0.0–0.4)
Eos: 1 %
Hematocrit: 45.6 % (ref 37.5–51.0)
Hemoglobin: 15.4 g/dL (ref 13.0–17.7)
Immature Grans (Abs): 0 10*3/uL (ref 0.0–0.1)
Immature Granulocytes: 0 %
Lymphocytes Absolute: 3.1 10*3/uL (ref 0.7–3.1)
Lymphs: 39 %
MCH: 28.6 pg (ref 26.6–33.0)
MCHC: 33.8 g/dL (ref 31.5–35.7)
MCV: 85 fL (ref 79–97)
Monocytes Absolute: 0.5 10*3/uL (ref 0.1–0.9)
Monocytes: 6 %
Neutrophils Absolute: 4.1 10*3/uL (ref 1.4–7.0)
Neutrophils: 53 %
Platelets: 275 10*3/uL (ref 150–450)
RBC: 5.38 x10E6/uL (ref 4.14–5.80)
RDW: 13.4 % (ref 11.6–15.4)
WBC: 7.8 10*3/uL (ref 3.4–10.8)

## 2019-11-19 LAB — LIPID PANEL
Chol/HDL Ratio: 3.9 ratio (ref 0.0–5.0)
Cholesterol, Total: 223 mg/dL — ABNORMAL HIGH (ref 100–199)
HDL: 57 mg/dL (ref >39)
LDL Chol Calc (NIH): 150 mg/dL — ABNORMAL HIGH (ref 0–99)
Triglycerides: 88 mg/dL (ref 0–149)
VLDL Cholesterol Cal: 16 mg/dL (ref 5–40)

## 2019-11-19 LAB — HEPATITIS B SURFACE ANTIGEN: Hepatitis B Surface Ag: NEGATIVE

## 2019-11-19 LAB — RPR: RPR Ser Ql: NONREACTIVE

## 2019-11-19 LAB — HIV ANTIBODY (ROUTINE TESTING W REFLEX): HIV Screen 4th Generation wRfx: NONREACTIVE

## 2019-11-19 LAB — HEPATITIS C ANTIBODY: Hep C Virus Ab: <0.1 s/co ratio (ref 0.0–0.9)

## 2019-11-19 LAB — PSA: Prostate Specific Ag, Serum: 0.9 ng/mL (ref 0.0–4.0)

## 2019-11-20 LAB — GC/CHLAMYDIA PROBE AMP
Chlamydia trachomatis, NAA: NEGATIVE
Neisseria Gonorrhoeae by PCR: NEGATIVE

## 2019-12-29 ENCOUNTER — Ambulatory Visit: Payer: BC Managed Care – PPO | Admitting: Medical

## 2019-12-29 ENCOUNTER — Other Ambulatory Visit: Payer: Self-pay

## 2019-12-29 ENCOUNTER — Encounter: Payer: Self-pay | Admitting: Medical

## 2019-12-29 VITALS — BP 132/88 | HR 83 | Ht 69.0 in | Wt 201.4 lb

## 2019-12-29 DIAGNOSIS — R7301 Impaired fasting glucose: Secondary | ICD-10-CM

## 2019-12-29 DIAGNOSIS — R748 Abnormal levels of other serum enzymes: Secondary | ICD-10-CM | POA: Diagnosis not present

## 2019-12-29 DIAGNOSIS — B351 Tinea unguium: Secondary | ICD-10-CM

## 2019-12-29 DIAGNOSIS — R7989 Other specified abnormal findings of blood chemistry: Secondary | ICD-10-CM

## 2019-12-29 DIAGNOSIS — R519 Headache, unspecified: Secondary | ICD-10-CM

## 2019-12-29 DIAGNOSIS — R0683 Snoring: Secondary | ICD-10-CM

## 2019-12-29 DIAGNOSIS — G478 Other sleep disorders: Secondary | ICD-10-CM

## 2019-12-29 MED ORDER — TERBINAFINE HCL 250 MG PO TABS: 250.0000 mg | ORAL_TABLET | Freq: Every day | ORAL | 0 refills | Status: DC

## 2019-12-29 NOTE — Progress Notes (Signed)
na

## 2019-12-29 NOTE — Progress Notes (Signed)
Done

## 2019-12-29 NOTE — Progress Notes (Signed)
Patient was referred to [facility] and to nutrition.
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
## 2019-12-30 LAB — BUN+CREAT
BUN/Creatinine Ratio: 9 (ref 9–20)
BUN: 12 mg/dL (ref 6–24)
Creatinine, Ser: 1.27 mg/dL (ref 0.76–1.27)
GFR calc Af Amer: 77 mL/min/{1.73_m2} (ref >59)
GFR calc non Af Amer: 67 mL/min/{1.73_m2} (ref >59)

## 2019-12-30 LAB — HEMOGLOBIN A1C
Est. average glucose Bld gHb Est-mCnc: 120 mg/dL
Hgb A1c MFr Bld: 5.8 % — ABNORMAL HIGH (ref 4.8–5.6)

## 2019-12-30 LAB — VITAMIN D 25 HYDROXY (VIT D DEFICIENCY, FRACTURES): Vit D, 25-Hydroxy: 39.9 ng/mL (ref 30.0–100.0)

## 2020-01-31 ENCOUNTER — Ambulatory Visit: Payer: BC Managed Care – PPO

## 2020-02-15 ENCOUNTER — Ambulatory Visit (INDEPENDENT_AMBULATORY_CARE_PROVIDER_SITE_OTHER): Payer: BC Managed Care – PPO

## 2020-02-15 ENCOUNTER — Telehealth: Payer: Self-pay

## 2020-02-15 DIAGNOSIS — Z23 Encounter for immunization: Secondary | ICD-10-CM

## 2020-02-15 NOTE — Telephone Encounter (Signed)
We started Lamisil early October.  He is due back for follow-up and labs as we had to monitor labs before we continue the medicine and I need to look at his toenails

## 2020-02-15 NOTE — Telephone Encounter (Signed)
Pt is requesting to fill his lamisil. Please advise [facility]

## 2020-02-20 NOTE — Telephone Encounter (Signed)
Called pt. Got him scheduled for a f/u on 03/02/20.

## 2020-03-02 ENCOUNTER — Other Ambulatory Visit: Payer: Self-pay

## 2020-03-02 ENCOUNTER — Ambulatory Visit: Payer: BC Managed Care – PPO | Admitting: Medical

## 2020-03-02 ENCOUNTER — Encounter: Payer: Self-pay | Admitting: Medical

## 2020-03-02 VITALS — BP 134/88 | HR 77 | Ht 69.0 in | Wt 202.6 lb

## 2020-03-02 DIAGNOSIS — Z6829 Body mass index (BMI) 29.0-29.9, adult: Secondary | ICD-10-CM

## 2020-03-02 DIAGNOSIS — E559 Vitamin D deficiency, unspecified: Secondary | ICD-10-CM | POA: Diagnosis not present

## 2020-03-02 DIAGNOSIS — Z79899 Other long term (current) drug therapy: Secondary | ICD-10-CM

## 2020-03-02 DIAGNOSIS — R7301 Impaired fasting glucose: Secondary | ICD-10-CM

## 2020-03-02 DIAGNOSIS — R748 Abnormal levels of other serum enzymes: Secondary | ICD-10-CM

## 2020-03-02 DIAGNOSIS — R0683 Snoring: Secondary | ICD-10-CM

## 2020-03-02 DIAGNOSIS — B351 Tinea unguium: Secondary | ICD-10-CM | POA: Diagnosis not present

## 2020-03-02 MED ORDER — VITAMIN D 25 MCG (1000 UNIT) PO TABS: 1000.0000 [IU] | ORAL_TABLET | Freq: Every day | ORAL | 3 refills | Status: DC

## 2020-03-02 NOTE — Progress Notes (Signed)
Subjective:  Derek Cowan is a 49 y.o. male who presents for Chief Complaint  Patient presents with  . Follow-up    Lamisil and labs. Has not had sleep study      Last visit October we began Lamisil.  He does see some new better nail growing in.  Wants to do another round of Lamisil.    Last visit we discussed snoring, possible apnea . He hasn't been able to contact sleep study due to having to go take care of father in [place].  Father just had major hospitalization for bad prostate issues , urinary retention, kidney damage, new diagnosis of diabetes.     Last visit we reviewed abnormal labs from physical a few months ago.   Here to review.    No other aggravating or relieving factors.    No other c/o.  The following portions of the patient's history were reviewed and updated as appropriate: allergies, current medications, past family history, past medical history, past social history, past surgical history and problem list.  ROS Otherwise as in subjective above   Objective: BP 134/88   Pulse 77   Ht 5' 9"  (1.753 m)   Wt 202 lb 9.6 oz (91.9 kg)   SpO2 97%   BMI 29.92 kg/m   General appearance: alert, no distress, well developed, well nourished Great toenails have new pink nail at nail base for about the first 1/4 of nail, but rest of great nails with darker brown coloration.  Rest of toenails fine.    Assessment: Encounter Diagnoses  Name Primary?  Marland Kitchen Onychomycosis Yes  . Snoring   . Vitamin D deficiency   . Alkaline phosphatase elevation   . Impaired fasting blood sugar   . BMI 29.0-29.9,adult   . High risk medication use      Plan: Toenail fungus-he completed 1 month and does show improvement of the nails.  If the liver tests come back normal we will do an additional 2 to 3 weeks of Lamisil oral.  Snoring-consider sleep study.  He will call the sleep testing center back.  He has some family issues to take care of which delayed him having  evaluation  Vitamin D-begin supplement below  Impaired glucose-counseled on healthy diet and trying to prevent progression to diabetes  Alkaline phosphatase elevation-mild elevation.  We will continue to monitor this.  Could be due to vitamin D on the low end.  We will start with treatment for this and see if it changes the alkaline phosphatase on next visit  Derek Cowan was seen today for follow-up.  Diagnoses and all orders for this visit:  Onychomycosis -     Comprehensive metabolic panel  Snoring  Vitamin D deficiency  Alkaline phosphatase elevation  Impaired fasting blood sugar -     Comprehensive metabolic panel  BMI 27.2-53.6,UYQIH  High risk medication use -     Comprehensive metabolic panel  Other orders -     cholecalciferol (VITAMIN D3) 25 MCG (1000 UNIT) tablet; Take 1 tablet (1,000 Units total) by mouth daily.    Follow up: pending lab

## 2020-03-03 LAB — COMPREHENSIVE METABOLIC PANEL
ALT: 15 IU/L (ref 0–44)
AST: 12 IU/L (ref 0–40)
Albumin/Globulin Ratio: 1.9 (ref 1.2–2.2)
Albumin: 4.7 g/dL (ref 4.0–5.0)
Alkaline Phosphatase: 119 IU/L (ref 44–121)
BUN/Creatinine Ratio: 10 (ref 9–20)
BUN: 12 mg/dL (ref 6–24)
Bilirubin Total: 0.5 mg/dL (ref 0.0–1.2)
CO2: 26 mmol/L (ref 20–29)
Calcium: 9.1 mg/dL (ref 8.7–10.2)
Chloride: 102 mmol/L (ref 96–106)
Creatinine, Ser: 1.17 mg/dL (ref 0.76–1.27)
GFR calc Af Amer: 85 mL/min/{1.73_m2} (ref >59)
GFR calc non Af Amer: 74 mL/min/{1.73_m2} (ref >59)
Globulin, Total: 2.5 g/dL (ref 1.5–4.5)
Glucose: 85 mg/dL (ref 65–99)
Potassium: 4.2 mmol/L (ref 3.5–5.2)
Sodium: 142 mmol/L (ref 134–144)
Total Protein: 7.2 g/dL (ref 6.0–8.5)

## 2020-03-06 ENCOUNTER — Other Ambulatory Visit: Payer: Self-pay | Admitting: Medical

## 2020-03-06 MED ORDER — TERBINAFINE HCL 250 MG PO TABS: 250.0000 mg | ORAL_TABLET | Freq: Every day | ORAL | 0 refills | Status: DC

## 2020-11-20 ENCOUNTER — Encounter: Payer: Self-pay | Admitting: Medical

## 2020-11-20 ENCOUNTER — Other Ambulatory Visit: Payer: Self-pay

## 2020-11-20 ENCOUNTER — Ambulatory Visit (INDEPENDENT_AMBULATORY_CARE_PROVIDER_SITE_OTHER): Payer: BC Managed Care – PPO | Admitting: Medical

## 2020-11-20 VITALS — BP 130/80 | HR 92 | Ht 68.25 in | Wt 198.6 lb

## 2020-11-20 DIAGNOSIS — R7301 Impaired fasting glucose: Secondary | ICD-10-CM

## 2020-11-20 DIAGNOSIS — Z23 Encounter for immunization: Secondary | ICD-10-CM

## 2020-11-20 DIAGNOSIS — L309 Dermatitis, unspecified: Secondary | ICD-10-CM | POA: Diagnosis not present

## 2020-11-20 DIAGNOSIS — I8393 Asymptomatic varicose veins of bilateral lower extremities: Secondary | ICD-10-CM

## 2020-11-20 DIAGNOSIS — Z6829 Body mass index (BMI) 29.0-29.9, adult: Secondary | ICD-10-CM

## 2020-11-20 DIAGNOSIS — R748 Abnormal levels of other serum enzymes: Secondary | ICD-10-CM | POA: Diagnosis not present

## 2020-11-20 DIAGNOSIS — Z0000 Encounter for general adult medical examination without abnormal findings: Secondary | ICD-10-CM | POA: Diagnosis not present

## 2020-11-20 DIAGNOSIS — L918 Other hypertrophic disorders of the skin: Secondary | ICD-10-CM

## 2020-11-20 DIAGNOSIS — Z125 Encounter for screening for malignant neoplasm of prostate: Secondary | ICD-10-CM

## 2020-11-20 DIAGNOSIS — Z13220 Encounter for screening for lipoid disorders: Secondary | ICD-10-CM

## 2020-11-20 DIAGNOSIS — L659 Nonscarring hair loss, unspecified: Secondary | ICD-10-CM

## 2020-11-20 DIAGNOSIS — R202 Paresthesia of skin: Secondary | ICD-10-CM

## 2020-11-20 DIAGNOSIS — Z1211 Encounter for screening for malignant neoplasm of colon: Secondary | ICD-10-CM

## 2020-11-20 DIAGNOSIS — R0683 Snoring: Secondary | ICD-10-CM

## 2020-11-20 DIAGNOSIS — E559 Vitamin D deficiency, unspecified: Secondary | ICD-10-CM

## 2020-11-20 NOTE — Patient Instructions (Signed)
This visit was a preventative care visit, also known as wellness visit or routine physical.   Topics typically include healthy lifestyle, diet, exercise, preventative care, vaccinations, sick and well care, proper use of emergency dept and after hours care, as well as other concerns.     Recommendations: Continue to return yearly for your annual wellness and preventative care visits.  This gives [place] a chance to discuss healthy lifestyle, exercise, vaccinations, review your chart record, and perform screenings where appropriate.  I recommend you see your eye doctor yearly for routine vision care.  I recommend you see your dentist yearly for routine dental care including hygiene visits twice yearly.   Vaccination recommendations were reviewed Immunization History  Administered Date(s) Administered   Hepatitis B, adult 11/10/2014   Influenza,inj,Quad PF,6+ Mos 11/18/2019   PFIZER(Purple Top)SARS-COV-2 Vaccination 05/27/2019, 06/17/2019, 02/15/2020, 11/07/2020   Tdap 11/08/2014, 11/18/2019    I recommend  a yearly flu shot  Counseled on the influenza virus vaccine.  Vaccine information sheet given.  Influenza vaccine given after consent obtained.   Screening for cancer: Colon cancer screening: I recommend a baseline colon cancer screen.  Please call your insurance company to check coverage for colon cancer screening.  Options may include Cologard stool test or Colonoscopy.  You should also inquire about which facility the colonoscopy could be performed, and coverage for diagnostic vs screening colonoscopy as coverage may vary.  If you have significant family history of colon cancer or blood in the stool, then you should only do the colonoscopy, not the cologard test.  We discussed PSA, prostate exam, and prostate cancer screening risks/benefits.   PSA lab today  Skin cancer screening: Check your skin regularly for new changes, growing lesions, or other lesions of concern Come in for  evaluation if you have skin lesions of concern.  Lung cancer screening: If you have a greater than 20 pack year history of tobacco use, then you may qualify for lung cancer screening with a chest CT scan.   Please call your insurance company to inquire about coverage for this test.  We currently don't have screenings for other cancers besides breast, cervical, colon, and lung cancers.  If you have a strong family history of cancer or have other cancer screening concerns, please let me know.    Bone health: Get at least 150 minutes of aerobic exercise weekly Get weight bearing exercise at least once weekly Bone density test:  A bone density test is an imaging test that uses a type of X-ray to measure the amount of calcium and other minerals in your bones. The test may be used to diagnose or screen you for a condition that causes weak or thin bones (osteoporosis), predict your risk for a broken bone (fracture), or determine how well your osteoporosis treatment is working. The bone density test is recommended for females 19 and older, or females or males <16 if certain risk factors such as thyroid disease, long term use of steroids such as for asthma or rheumatological issues, vitamin D deficiency, estrogen deficiency, family history of osteoporosis, self or family history of fragility fracture in first degree relative.    Heart health: Get at least 150 minutes of aerobic exercise weekly Limit alcohol It is important to maintain a healthy blood pressure and healthy cholesterol numbers  Heart disease screening: Screening for heart disease includes screening for blood pressure, fasting lipids, glucose/diabetes screening, BMI height to weight ratio, reviewed of smoking status, physical activity, and diet.  Goals include blood pressure 120/80 or less, maintaining a healthy lipid/cholesterol profile, preventing diabetes or keeping diabetes numbers under good control, not smoking or using tobacco  products, exercising most days per week or at least 150 minutes per week of exercise, and eating healthy variety of fruits and vegetables, healthy oils, and avoiding unhealthy food choices like fried food, fast food, high sugar and high cholesterol foods.    Other tests may possibly include EKG test, CT coronary calcium score, echocardiogram, exercise treadmill stress test.    Medical care options: I recommend you continue to seek care here first for routine care.  We try really hard to have available appointments Monday through Friday daytime hours for sick visits, acute visits, and physicals.  Urgent care should be used for after hours and weekends for significant issues that cannot wait till the next day.  The emergency department should be used for significant potentially life-threatening emergencies.  The emergency department is expensive, can often have long wait times for less significant concerns, so try to utilize primary care, urgent care, or telemedicine when possible to avoid unnecessary trips to the emergency department.  Virtual visits and telemedicine have been introduced since the pandemic started in 2020, and can be convenient ways to receive medical care.  We offer virtual appointments as well to assist you in a variety of options to seek medical care.   Separate significant issues discussed: Impaired fasting glucose -updated labs today  Borderline BP -losing weight will help her blood pressure.  Eat a low-salt diet, work on regular exercise.  Varicose veins -regular exercise helps as well as compression hose  BMI 29 -requires to lose weight through healthy diet and exercise.  Exercise most days per week  Skin tags -we discussed his multiple skin tags.  Advised he return soon for procedure for removal.  Discussed that insurance may not cover this possibly.  We discussed the benign nature of these lesions  Hair loss -advised Selsun Blue twice weekly for the next 1 to 2 months.   If not seeing improvement he may ultimately need to see dermatology or try Rogaine.  I suspect there could be some mild seborrheic dermatitis though  Alkaline phosphatase blood test was elevated in the past.  This is likely due to vitamin D deficiency.  I am rechecking your labs today

## 2020-11-20 NOTE — Progress Notes (Signed)
Subjective:   HPI  Derek Cowan is a 50 y.o. male who presents for Chief Complaint  Patient presents with   fasitng cpe    Fasting cpe. Having some tingling in legs and arm. Flu shot today    Patient Care Team: Unique Searfoss, Leward Quan as PCP - General (Family Medicine) Sees dentist Sees eye doctor Dr. Roseanne Kaufman and Dr. Eunice Blase, orthopedics  Concerns: Here for routine well visit.   He has been watchign dad have a lot of health issues including new diagnosis of diabetes and neuropathy.  Reviewed their medical, surgical, family, social, medication, and allergy history and updated chart as appropriate.  Past Medical History:  Diagnosis Date   Eczema    Impaired fasting blood sugar    Vitamin D deficiency     Past Surgical History:  Procedure Laterality Date   HAND SURGERY  2021   tumor of left hand, benign    Family History  Problem Relation Age of Onset   Lupus Mother    Lung disease Mother        ARDS   Hypertension Mother    Diabetes Father    Prostatitis Father    Arrhythmia Sister    Miscarriages / Stillbirths Sister    Hypertension Sister    Heart disease Paternal Aunt    Cancer Paternal Grandfather        lung   Stroke Neg Hx      Current Outpatient Medications:    cholecalciferol (VITAMIN D3) 25 MCG (1000 UNIT) tablet, Take 1 tablet (1,000 Units total) by mouth daily., Disp: 90 tablet, Rfl: 3   terbinafine (LAMISIL) 250 MG tablet, Take 1 tablet (250 mg total) by mouth daily., Disp: 30 tablet, Rfl: 0  Allergies  Allergen Reactions   Lactose Intolerance (Gi)      Review of Systems Constitutional: -fever, -chills, -sweats, -unexpected weight change, -decreased appetite, -fatigue Allergy: -sneezing, -itching, -congestion Dermatology: -changing moles, --rash, -lumps ENT: -runny nose, -ear pain, -sore throat, -hoarseness, -sinus pain, -teeth pain, - ringing in ears, -hearing loss, -nosebleeds Cardiology: -chest pain, -palpitations,  -swelling, -difficulty breathing when lying flat, -waking up short of breath Respiratory: -cough, -shortness of breath, -difficulty breathing with exercise or exertion, -wheezing, -coughing up blood Gastroenterology: -abdominal pain, -nausea, -vomiting, -diarrhea, -constipation, -blood in stool, -changes in bowel movement, -difficulty swallowing or eating Hematology: -bleeding, -bruising  Musculoskeletal: -joint aches, -muscle aches, -joint swelling, -back pain, -neck pain, -cramping, -changes in gait Ophthalmology: denies vision changes, eye redness, itching, discharge Urology: -burning with urination, -difficulty urinating, -blood in urine, -urinary frequency, -urgency, -incontinence Neurology: -headache, -weakness, -tingling, -numbness, -memory loss, -falls, -dizziness Psychology: -depressed mood, -agitation, -sleep problems Male GU: no testicular mass, pain, no lymph nodes swollen, no swelling, no rash.  Depression screen [facility] 2/9 11/20/2020 11/18/2019 11/27/2017  Decreased Interest 0 0 0  Down, Depressed, Hopeless 0 0 0  PHQ - 2 Score 0 0 0        Objective:  BP 130/80   Pulse 92   Ht 5' 8.25" (1.734 m)   Wt 198 lb 9.6 oz (90.1 kg)   BMI 29.98 kg/m   BP Readings from Last 3 Encounters:  11/20/20 130/80  03/02/20 134/88  12/29/19 132/88   Wt Readings from Last 3 Encounters:  11/20/20 198 lb 9.6 oz (90.1 kg)  03/02/20 202 lb 9.6 oz (91.9 kg)  12/29/19 201 lb 6.4 oz (91.4 kg)    General appearance: alert, no distress, WD/WN, African American male Skin:  Several benign skin tags, 3 on anterior neck, 1 on posterior neck, 1 on the right lateral chest wall, others on chest wall.  No worrisome lesions otherwise HEENT: normocephalic, conjunctiva/corneas normal, sclerae anicteric, PERRLA, EOMi, nares patent, no discharge or erythema, pharynx normal Oral cavity: MMM, tongue normal, teeth normal Neck: supple, no lymphadenopathy, no thyromegaly, no masses, normal ROM, no bruits Chest:  non tender, normal shape and expansion Heart: RRR, normal S1, S2, no murmurs Lungs: CTA bilaterally, no wheezes, rhonchi, or rales Abdomen: +bs, soft, non tender, non distended, no masses, no hepatomegaly, no splenomegaly, no bruits Back: non tender, normal ROM, no scoliosis Musculoskeletal: upper extremities non tender, no obvious deformity, normal ROM throughout, lower extremities non tender, no obvious deformity, normal ROM throughout Extremities: no edema, no cyanosis, no clubbing Pulses: 2+ symmetric, upper and lower extremities, normal cap refill Neurological: alert, oriented x 3, CN2-12 intact, strength normal upper extremities and lower extremities, sensation normal throughout, DTRs 2+ throughout, no cerebellar signs, gait normal Psychiatric: normal affect, behavior normal, pleasant  GU: normal male external genitalia,circumcised, nontender, no masses, no hernia, no lymphadenopathy Rectal: declined   Assessment and Plan :   Encounter Diagnoses  Name Primary?   Encounter for health maintenance examination in adult Yes   BMI 29.0-29.9,adult    Alkaline phosphatase elevation    Eczema, unspecified type    Impaired fasting blood sugar    Screen for colon cancer    Vitamin D deficiency    Snoring    Screening for prostate cancer    Screening for lipid disorders    Paresthesia    Varicose veins of both lower extremities, unspecified whether complicated    Skin tags, multiple acquired    Hair loss     This visit was a preventative care visit, also known as wellness visit or routine physical.   Topics typically include healthy lifestyle, diet, exercise, preventative care, vaccinations, sick and well care, proper use of emergency dept and after hours care, as well as other concerns.     Recommendations: Continue to return yearly for your annual wellness and preventative care visits.  This gives [place] a chance to discuss healthy lifestyle, exercise, vaccinations, review your chart  record, and perform screenings where appropriate.  I recommend you see your eye doctor yearly for routine vision care.  I recommend you see your dentist yearly for routine dental care including hygiene visits twice yearly.   Vaccination recommendations were reviewed Immunization History  Administered Date(s) Administered   Hepatitis B, adult 11/10/2014   Influenza,inj,Quad PF,6+ Mos 11/18/2019   PFIZER(Purple Top)SARS-COV-2 Vaccination 05/27/2019, 06/17/2019, 02/15/2020, 11/07/2020   Tdap 11/08/2014, 11/18/2019    I recommend  a yearly flu shot  Counseled on the influenza virus vaccine.  Vaccine information sheet given.  Influenza vaccine given after consent obtained.   Screening for cancer: Colon cancer screening: I recommend a baseline colon cancer screen.  Please call your insurance company to check coverage for colon cancer screening.  Options may include Cologard stool test or Colonoscopy.  You should also inquire about which facility the colonoscopy could be performed, and coverage for diagnostic vs screening colonoscopy as coverage may vary.  If you have significant family history of colon cancer or blood in the stool, then you should only do the colonoscopy, not the cologard test.  We discussed PSA, prostate exam, and prostate cancer screening risks/benefits.   PSA lab today  Skin cancer screening: Check your skin regularly for new changes, growing lesions, or other  lesions of concern Come in for evaluation if you have skin lesions of concern.  Lung cancer screening: If you have a greater than 20 pack year history of tobacco use, then you may qualify for lung cancer screening with a chest CT scan.   Please call your insurance company to inquire about coverage for this test.  We currently don't have screenings for other cancers besides breast, cervical, colon, and lung cancers.  If you have a strong family history of cancer or have other cancer screening concerns, please  let me know.    Bone health: Get at least 150 minutes of aerobic exercise weekly Get weight bearing exercise at least once weekly Bone density test:  A bone density test is an imaging test that uses a type of X-ray to measure the amount of calcium and other minerals in your bones. The test may be used to diagnose or screen you for a condition that causes weak or thin bones (osteoporosis), predict your risk for a broken bone (fracture), or determine how well your osteoporosis treatment is working. The bone density test is recommended for females 62 and older, or females or males <38 if certain risk factors such as thyroid disease, long term use of steroids such as for asthma or rheumatological issues, vitamin D deficiency, estrogen deficiency, family history of osteoporosis, self or family history of fragility fracture in first degree relative.    Heart health: Get at least 150 minutes of aerobic exercise weekly Limit alcohol It is important to maintain a healthy blood pressure and healthy cholesterol numbers  Heart disease screening: Screening for heart disease includes screening for blood pressure, fasting lipids, glucose/diabetes screening, BMI height to weight ratio, reviewed of smoking status, physical activity, and diet.    Goals include blood pressure 120/80 or less, maintaining a healthy lipid/cholesterol profile, preventing diabetes or keeping diabetes numbers under good control, not smoking or using tobacco products, exercising most days per week or at least 150 minutes per week of exercise, and eating healthy variety of fruits and vegetables, healthy oils, and avoiding unhealthy food choices like fried food, fast food, high sugar and high cholesterol foods.    Other tests may possibly include EKG test, CT coronary calcium score, echocardiogram, exercise treadmill stress test.    Medical care options: I recommend you continue to seek care here first for routine care.  We try  really hard to have available appointments Monday through Friday daytime hours for sick visits, acute visits, and physicals.  Urgent care should be used for after hours and weekends for significant issues that cannot wait till the next day.  The emergency department should be used for significant potentially life-threatening emergencies.  The emergency department is expensive, can often have long wait times for less significant concerns, so try to utilize primary care, urgent care, or telemedicine when possible to avoid unnecessary trips to the emergency department.  Virtual visits and telemedicine have been introduced since the pandemic started in 2020, and can be convenient ways to receive medical care.  We offer virtual appointments as well to assist you in a variety of options to seek medical care.   Separate significant issues discussed: Impaired fasting glucose -updated labs today  Borderline BP -losing weight will help her blood pressure.  Eat a low-salt diet, work on regular exercise.  Varicose veins -regular exercise helps as well as compression hose  BMI 29 -requires to lose weight through healthy diet and exercise.  Exercise most days per week  Skin tags -we discussed his multiple skin tags.  Advised he return soon for procedure for removal.  Discussed that insurance may not cover this possibly.  We discussed the benign nature of these lesions  Hair loss -advised Selsun Blue twice weekly for the next 1 to 2 months.  If not seeing improvement he may ultimately need to see dermatology or try Rogaine.  I suspect there could be some mild seborrheic dermatitis though  Alkaline phosphatase blood test was elevated in the past.  This is likely due to vitamin D deficiency.  I am rechecking your labs today   Derek Cowan was seen today for fasitng cpe.  Diagnoses and all orders for this visit:  Encounter for health maintenance examination in adult -     Comprehensive metabolic panel -     CBC -      Lipid panel -     PSA -     Hemoglobin A1c -     VITAMIN D 25 Hydroxy (Vit-D Deficiency, Fractures) -     Vitamin B12  BMI 29.0-29.9,adult  Alkaline phosphatase elevation -     Comprehensive metabolic panel  Eczema, unspecified type  Impaired fasting blood sugar -     Hemoglobin A1c  Screen for colon cancer  Vitamin D deficiency -     VITAMIN D 25 Hydroxy (Vit-D Deficiency, Fractures)  Snoring  Screening for prostate cancer -     PSA  Screening for lipid disorders -     Lipid panel  Paresthesia -     Vitamin B12  Varicose veins of both lower extremities, unspecified whether complicated  Skin tags, multiple acquired  Hair loss     Follow-up pending labs, yearly for physical

## 2020-11-20 NOTE — Addendum Note (Signed)
Addended by: Minette Headland A on: 11/20/2020 11:27 AM   Modules accepted: Orders

## 2020-11-21 LAB — COMPREHENSIVE METABOLIC PANEL
ALT: 14 IU/L (ref 0–44)
AST: 15 IU/L (ref 0–40)
Albumin/Globulin Ratio: 1.9 (ref 1.2–2.2)
Albumin: 5 g/dL (ref 4.0–5.0)
Alkaline Phosphatase: 120 IU/L (ref 44–121)
BUN/Creatinine Ratio: 10 (ref 9–20)
BUN: 13 mg/dL (ref 6–24)
Bilirubin Total: 0.8 mg/dL (ref 0.0–1.2)
CO2: 24 mmol/L (ref 20–29)
Calcium: 10.4 mg/dL — ABNORMAL HIGH (ref 8.7–10.2)
Chloride: 96 mmol/L (ref 96–106)
Creatinine, Ser: 1.31 mg/dL — ABNORMAL HIGH (ref 0.76–1.27)
Globulin, Total: 2.7 g/dL (ref 1.5–4.5)
Glucose: 100 mg/dL — ABNORMAL HIGH (ref 65–99)
Potassium: 4.4 mmol/L (ref 3.5–5.2)
Sodium: 137 mmol/L (ref 134–144)
Total Protein: 7.7 g/dL (ref 6.0–8.5)
eGFR: 67 mL/min/{1.73_m2} (ref >59)

## 2020-11-21 LAB — CBC
Hematocrit: 48.2 % (ref 37.5–51.0)
Hemoglobin: 15.8 g/dL (ref 13.0–17.7)
MCH: 27.9 pg (ref 26.6–33.0)
MCHC: 32.8 g/dL (ref 31.5–35.7)
MCV: 85 fL (ref 79–97)
Platelets: 268 10*3/uL (ref 150–450)
RBC: 5.66 x10E6/uL (ref 4.14–5.80)
RDW: 12.9 % (ref 11.6–15.4)
WBC: 6.1 10*3/uL (ref 3.4–10.8)

## 2020-11-21 LAB — LIPID PANEL
Chol/HDL Ratio: 3.6 ratio (ref 0.0–5.0)
Cholesterol, Total: 200 mg/dL — ABNORMAL HIGH (ref 100–199)
HDL: 55 mg/dL (ref >39)
LDL Chol Calc (NIH): 134 mg/dL — ABNORMAL HIGH (ref 0–99)
Triglycerides: 63 mg/dL (ref 0–149)
VLDL Cholesterol Cal: 11 mg/dL (ref 5–40)

## 2020-11-21 LAB — VITAMIN D 25 HYDROXY (VIT D DEFICIENCY, FRACTURES): Vit D, 25-Hydroxy: 62.4 ng/mL (ref 30.0–100.0)

## 2020-11-21 LAB — PSA: Prostate Specific Ag, Serum: 3.6 ng/mL (ref 0.0–4.0)

## 2020-11-21 LAB — HEMOGLOBIN A1C
Est. average glucose Bld gHb Est-mCnc: 128 mg/dL
Hgb A1c MFr Bld: 6.1 % — ABNORMAL HIGH (ref 4.8–5.6)

## 2020-11-21 LAB — VITAMIN B12: Vitamin B-12: 488 pg/mL (ref 232–1245)

## 2021-01-03 ENCOUNTER — Ambulatory Visit (INDEPENDENT_AMBULATORY_CARE_PROVIDER_SITE_OTHER): Payer: BC Managed Care – PPO | Admitting: Medical

## 2021-01-03 ENCOUNTER — Other Ambulatory Visit: Payer: Self-pay

## 2021-01-03 VITALS — BP 120/80 | HR 71 | Wt 187.2 lb

## 2021-01-03 DIAGNOSIS — R7989 Other specified abnormal findings of blood chemistry: Secondary | ICD-10-CM | POA: Diagnosis not present

## 2021-01-03 DIAGNOSIS — R972 Elevated prostate specific antigen [PSA]: Secondary | ICD-10-CM | POA: Diagnosis not present

## 2021-01-03 DIAGNOSIS — Z1211 Encounter for screening for malignant neoplasm of colon: Secondary | ICD-10-CM

## 2021-01-03 DIAGNOSIS — R7303 Prediabetes: Secondary | ICD-10-CM | POA: Diagnosis not present

## 2021-01-03 NOTE — Patient Instructions (Signed)

## 2021-01-03 NOTE — Progress Notes (Signed)
Subjective:  Derek Cowan is a 50 y.o. male who presents for Chief Complaint  Patient presents with   Follow-up    Follow-up on labs     Here to follow-up on lab results from his recent physical.  Has recent physical his PSA had jumped up for some reason.  He has no symptoms of concern  He check insurance since last visit and insurance does cover colon cancer screening.  He would like to do Cologuard.  He has no blood in the stool.  Last visit he was prediabetic.  Here to update labs.  He has been making dietary changes and eating healthy and exercising.  Here to recheck on creatinine and calcium lab.  Otherwise in normal state of health.  He declined nutrition consult after last visit because his dad has been dealing with health issues and he has been busy helping take care of his father.  No other aggravating or relieving factors.    No other c/o.  Past Medical History:  Diagnosis Date   Eczema    Impaired fasting blood sugar    Vitamin D deficiency    Current Outpatient Medications on File Prior to Visit  Medication Sig Dispense Refill   cholecalciferol (VITAMIN D3) 25 MCG (1000 UNIT) tablet Take 1 tablet (1,000 Units total) by mouth daily. 90 tablet 3   No current facility-administered medications on file prior to visit.     The following portions of the patient's history were reviewed and updated as appropriate: allergies, current medications, past family history, past medical history, past social history, past surgical history and problem list.  ROS Otherwise as in subjective above  Objective: BP 120/80   Pulse 71   Wt 187 lb 3.2 oz (84.9 kg)   BMI 28.26 kg/m   General appearance: alert, no distress, well developed, well nourished   Assessment: Encounter Diagnoses  Name Primary?   Elevated PSA Yes   Prediabetes    Elevated serum creatinine    Screen for colon cancer      Plan: Elevated PSA on screening recently.  Recheck PSA levels today.  We  discussed the significance of the PSA test, risk and benefits of testing, possible differential.  Prediabetes - glucose lab today fasting.  Discussed diagnosis and efforts to reduce progression to diabetes.  Discussed possibly adding metformin.   Prediabetes means you have a higher than normal blood sugar level. It's not high enough to be considered type 2 diabetes yet, but without making some lifestyle changes you are more likely to develop type 2 diabetes.  If you have prediabetes, the long-term damage of diabetes, especially to your heart, blood vessels and kidneys may already be starting. You may not be able to change certain risk factors such as age, race, or family history, but you CAN make changes to your lifestyle, your eating habits, and your activity.  Although diabetes can develop at any age, the risk of prediabetes increases after age 48.  Your risk of prediabetes increases if you have a parent or sibling with type 2 diabetes.   Although it's unclear why, certain people including Black, Hispanic, American [place] American people, are more likely to develop prediabetes.  Ways to prevent or slow progression to diabetes: Eat healthy foods - Eating red meat and processed meat, and drinking sugar-sweetened beverages, is associated with a higher risk of prediabetes. A diet high in fruits, vegetables, nuts, whole grains and olive oil is associated with a lower risk of prediabetes. Get  at least 150 minutes of moderate aerobic physical activity a week, or about 30 minutes on most days of the week.  The less active you are, the greater your risk of prediabetes. Physical activity helps you control your weight, uses up sugar for energy and makes the body use insulin more effectively. Lose excess weight - Being overweight is a primary risk factor for prediabetes. The more fatty tissue you have, especially inside and between the muscle and skin around your abdomen, the more resistant your cells  become to insulin. Waist size. A large waist size can indicate insulin resistance. The risk of insulin resistance goes up for men with waists larger than 40 inches and for women with waists larger than 35 inches. Control your blood pressure and cholesterol.  If your blood pressure is not 130/80 or less, discuss with your provider to help get this under control.  It is ideal to have an HDL good cholesterol number >50 and have a LDL bad cholesterol number <100.   Don't smoke One simple strategy to help you make good food choices and eat appropriate portions sizes is to divide up your plate. These three divisions on your plate promote healthy eating:  One-half: fruit and nonstarchy vegetables One-quarter: whole grains One-quarter: protein-rich foods, such as legumes, fish or lean meats    Elevated creatinine - recheck lab today and urine  Referral for Cologuard for screening.   Derek Cowan was seen today for follow-up.  Diagnoses and all orders for this visit:  Elevated PSA -     PSA, total and free  Prediabetes -     Basic metabolic panel -     Urinalysis  Elevated serum creatinine -     Basic metabolic panel -     Urinalysis  Screen for colon cancer -     Cologuard   Follow up: pending labs

## 2021-01-04 LAB — URINALYSIS
Bilirubin, UA: NEGATIVE
Glucose, UA: NEGATIVE
Leukocytes,UA: NEGATIVE
Nitrite, UA: NEGATIVE
Protein,UA: NEGATIVE
RBC, UA: NEGATIVE
Specific Gravity, UA: 1.02 (ref 1.005–1.030)
Urobilinogen, Ur: 0.2 mg/dL (ref 0.2–1.0)
pH, UA: 5.5 (ref 5.0–7.5)

## 2021-01-04 LAB — PSA, TOTAL AND FREE
PSA, Free Pct: 44.4 %
PSA, Free: 0.4 ng/mL
Prostate Specific Ag, Serum: 0.9 ng/mL (ref 0.0–4.0)

## 2021-01-04 LAB — BASIC METABOLIC PANEL
BUN/Creatinine Ratio: 8 — ABNORMAL LOW (ref 9–20)
BUN: 10 mg/dL (ref 6–24)
CO2: 23 mmol/L (ref 20–29)
Calcium: 9.6 mg/dL (ref 8.7–10.2)
Chloride: 101 mmol/L (ref 96–106)
Creatinine, Ser: 1.21 mg/dL (ref 0.76–1.27)
Glucose: 92 mg/dL (ref 70–99)
Potassium: 4.7 mmol/L (ref 3.5–5.2)
Sodium: 140 mmol/L (ref 134–144)
eGFR: 74 mL/min/{1.73_m2} (ref >59)

## 2021-02-06 ENCOUNTER — Other Ambulatory Visit: Payer: Self-pay

## 2021-02-06 ENCOUNTER — Ambulatory Visit: Payer: BC Managed Care – PPO | Admitting: Medical

## 2021-02-06 ENCOUNTER — Encounter: Payer: Self-pay | Admitting: Medical

## 2021-02-06 VITALS — BP 132/88 | HR 64 | Wt 187.2 lb

## 2021-02-06 DIAGNOSIS — R7303 Prediabetes: Secondary | ICD-10-CM

## 2021-02-06 DIAGNOSIS — I8393 Asymptomatic varicose veins of bilateral lower extremities: Secondary | ICD-10-CM

## 2021-02-06 DIAGNOSIS — Z6829 Body mass index (BMI) 29.0-29.9, adult: Secondary | ICD-10-CM

## 2021-02-06 DIAGNOSIS — R7301 Impaired fasting glucose: Secondary | ICD-10-CM

## 2021-02-06 DIAGNOSIS — L989 Disorder of the skin and subcutaneous tissue, unspecified: Secondary | ICD-10-CM

## 2021-02-06 NOTE — Progress Notes (Signed)
Subjective:  Derek Cowan is a 50 y.o. male who presents for Chief Complaint  Patient presents with   Prediabetes   Skin Problem     Here for recheck.  I saw him in recent months for a physical.  We discussed pre-diabetes at that that time.  Since then he has been working with diet and exercise and has lost about 10 pounds.  Last visit we discussed possibly using medications.  He wanted to re discuss this versus other options  The visit was initially set up for skin tag removal.  We saw him for his physical he had several skin tags on his chest and neck.  He is not sure insurance covers this.  He was not sure that is what he was coming in for today  No other aggravating or relieving factors.    No other c/o.  The following portions of the patient's history were reviewed and updated as appropriate: allergies, current medications, past family history, past medical history, past social history, past surgical history and problem list.  ROS Otherwise as in subjective above   Objective: BP 132/88 (BP Location: Right Arm, Patient Position: Sitting)   Pulse 64   Wt 187 lb 3.2 oz (84.9 kg)   SpO2 95%   BMI 28.26 kg/m   General appearance: alert, no distress, well developed, well nourished Skin: Several pedunculated small brown skin tags, scattered on left and right neck, left upper back, left chest wall anteriorly, approximately 7 small pedunculated benign-appearing skin tags    Assessment: Encounter Diagnoses  Name Primary?   Prediabetes Yes   Skin problem    Varicose veins of both lower extremities, unspecified whether complicated    Impaired fasting blood sugar    BMI 29.0-29.9,adult      Plan: Prediabetes, impaired glucose, obesity-congratulated him on his recent 10 pound weight loss.  Continue efforts with healthy diet and exercise.  After discussing other options to assist such as medications or other strategies he agrees to referral to weight loss study being offered in  the area  I recommended that if he is not able to go through the study to call back and we will consider medication such as Ozempic or Mounjaro or Qsymia  Skin lesions-he called insurance while he was here and they do not cover skin tag removal procedure.  He will let me know if he wants referral to dermatology   Derek Cowan was seen today for prediabetes and skin problem.  Diagnoses and all orders for this visit:  Prediabetes  Skin problem  Varicose veins of both lower extremities, unspecified whether complicated  Impaired fasting blood sugar  BMI 29.0-29.9,adult  Follow up: pending weight loss study

## 2021-02-26 LAB — COLOGUARD: COLOGUARD: NEGATIVE

## 2021-03-06 IMAGING — MR MR [PERSON_NAME]*[PERSON_NAME]* WO/W CM
10 series · 40 of 40 positions shown · IV contrast (20ml Multihance)
Comparison: None.

CLINICAL DATA: Index finger nodule since [REDACTED].

EXAM:
MRI OF THE LEFT FINGERS WITHOUT AND WITH CONTRAST
TECHNIQUE: Multiplanar, multisequence MR imaging of the left hand was performed
before and after the administration of intravenous contrast.
CONTRAST:  20mL MULTIHANCE GADOBENATE DIMEGLUMINE 529 MG/ML IV SOLN

[Series 5: T1 · axial · 4.5mm · 0.38mm/px · z∈[-57,+123]mm · 5 of 33 slices shown (1 of 3)]
[im 1/33]
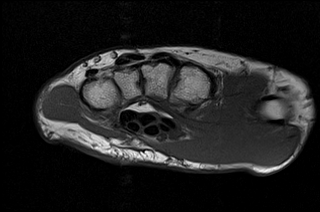
[im 9/33]
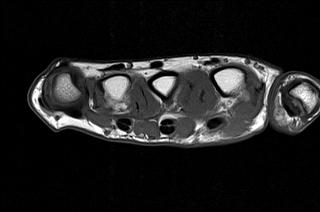
[im 17/33]
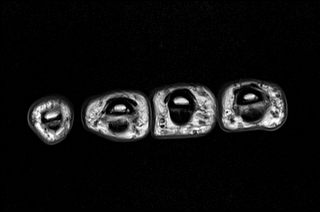
[im 25/33]
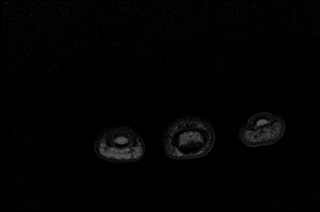
[im 33/33]
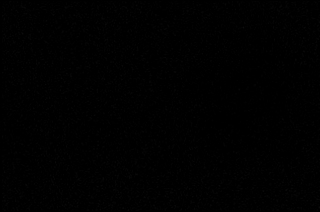

[Series 7: PD fat-sat · sagittal · 3.0mm · 0.53mm/px · 4 of 30 slices shown]
[im 1/30]
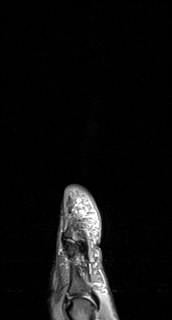
[im 10/30]
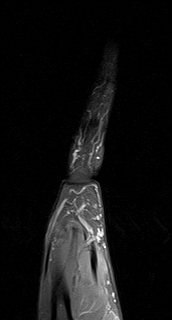
[im 20/30]
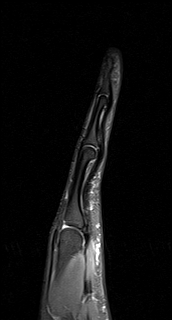
[im 30/30]
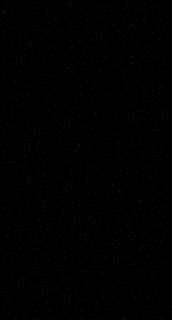

[Series 8: T1 fat-sat · axial · 4.5mm · 0.35mm/px · z∈[-53,+121]mm · 4 of 32 slices shown (1 of 3)]
[im 1/32]
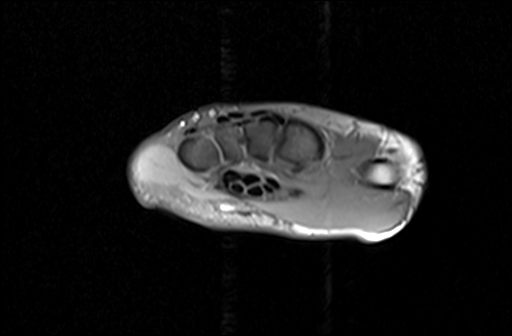
[im 11/32]
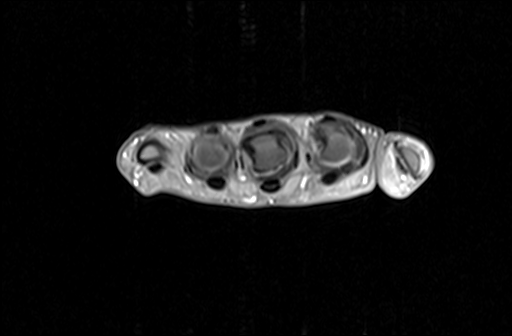
[im 21/32]
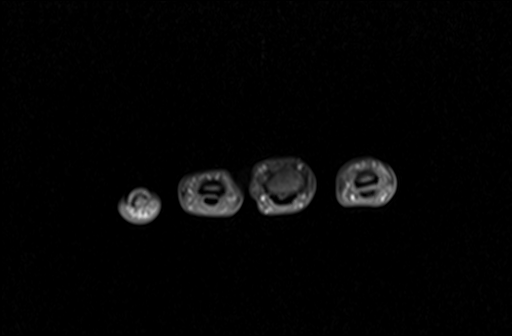
[im 32/32]
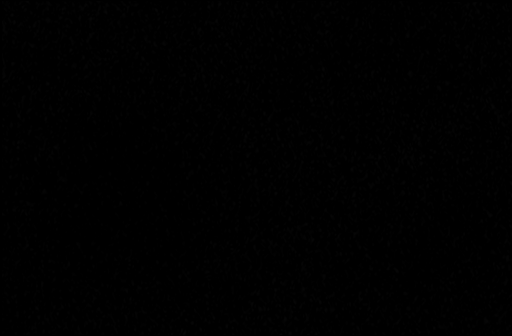

[Series 9: STIR · coronal · 3.0mm · 0.35mm/px · 2 of 18 slices shown]
[im 1/18]
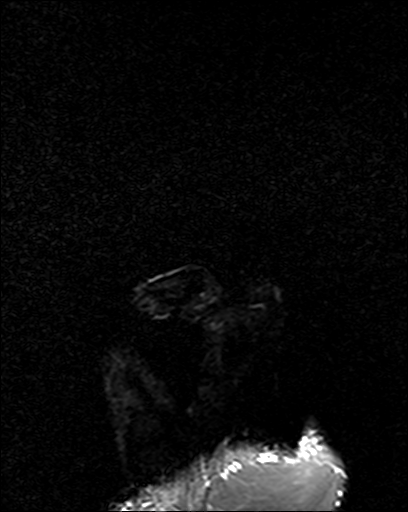
[im 18/18]
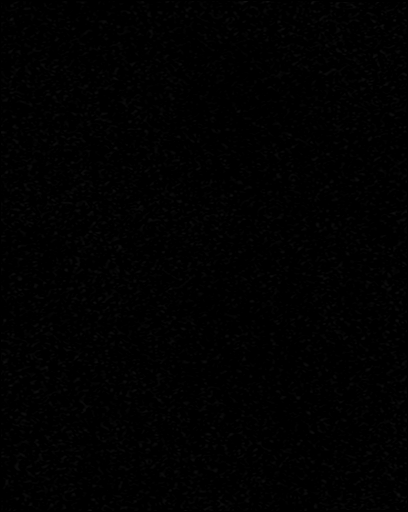

[Series 10: T1 · sagittal · 2.0mm · 0.39mm/px · 3 of 22 slices shown (2 of 3)]
[im 1/22]
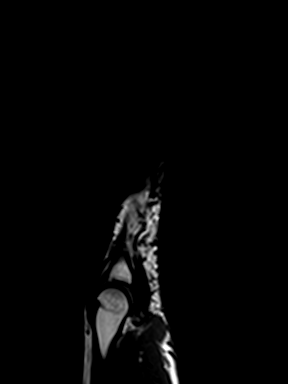
[im 11/22]
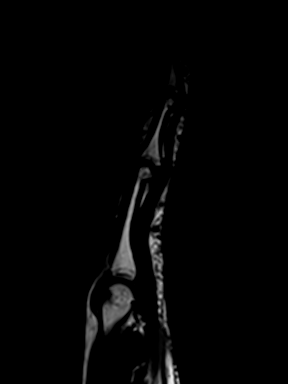
[im 22/22]
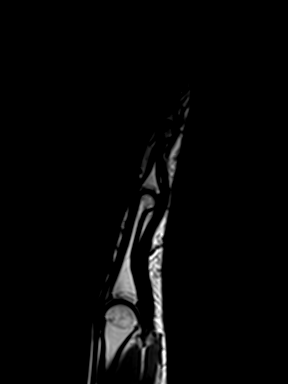

[Series 11: T2 fat-sat · axial · 2.5mm · 0.31mm/px · z∈[-18,+102]mm · 6 of 49 slices shown]
[im 1/49]
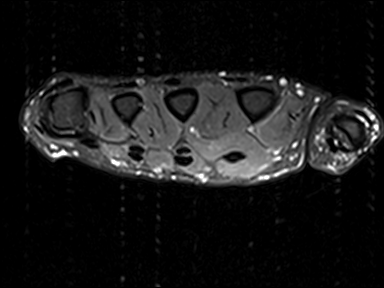
[im 10/49]
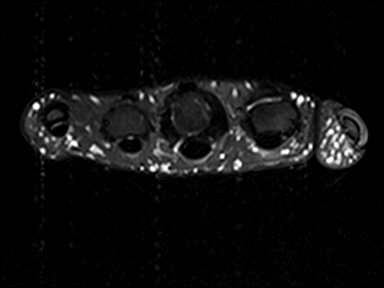
[im 20/49]
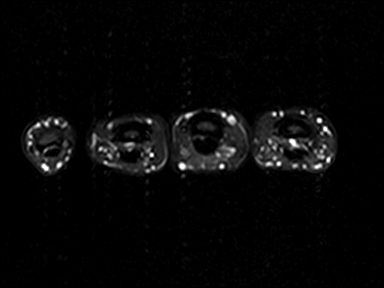
[im 29/49]
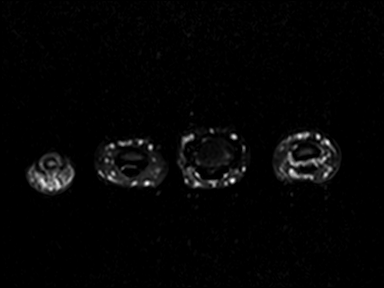
[im 39/49]
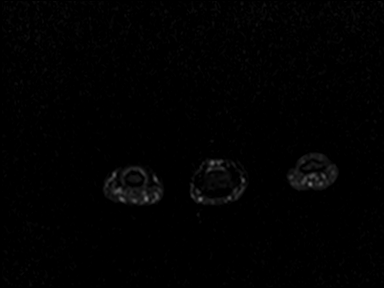
[im 49/49]
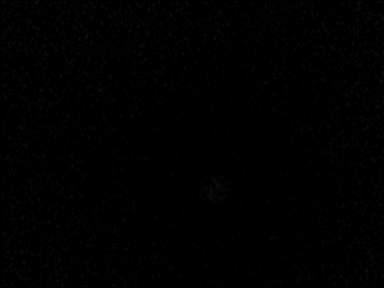

[Series 12: T1 fat-sat · axial · 2.5mm · 0.31mm/px · z∈[-18,+105]mm · 7 of 50 slices shown (2 of 3)]
[im 1/50]
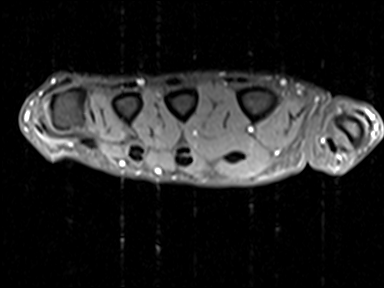
[im 9/50]
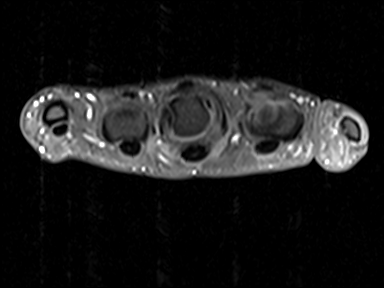
[im 17/50]
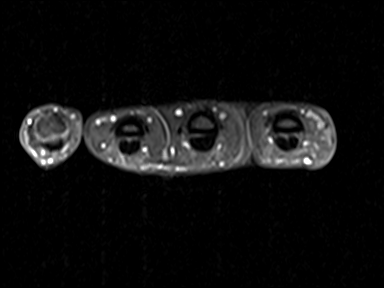
[im 25/50]
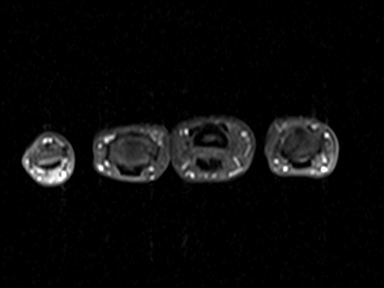
[im 33/50]
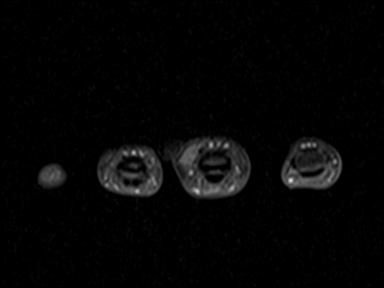
[im 41/50]
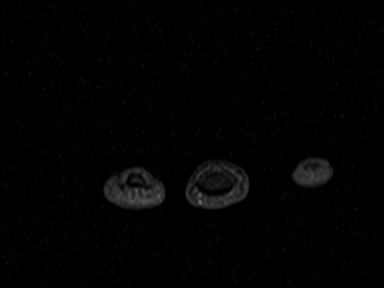
[im 50/50]
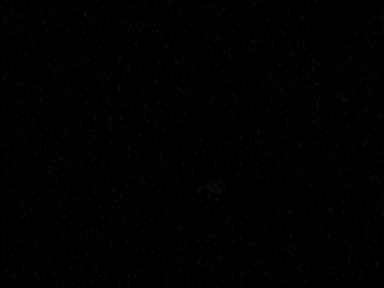

[Series 13: T1 · coronal · 3.0mm · 0.56mm/px · 2 of 18 slices shown (3 of 3)]
[im 1/18]
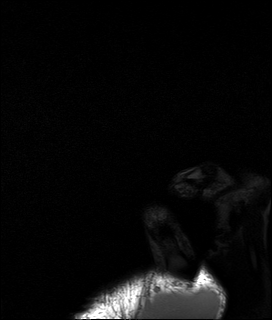
[im 18/18]
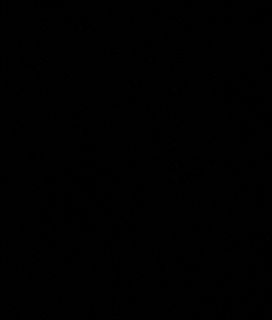

[Series 14: T1 fat-sat post-contrast · coronal · 4.0mm · 0.35mm/px · 1 of 11 slices shown]
[im 1/11]
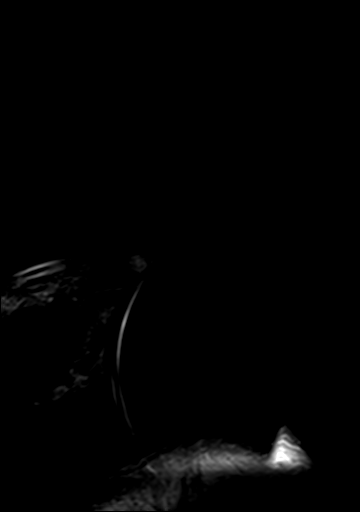

[Series 15: T1 fat-sat · axial · 2.5mm · 0.47mm/px · z∈[-15,+107]mm · 6 of 44 slices shown (3 of 3)]
[im 1/44]
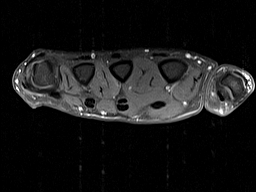
[im 9/44]
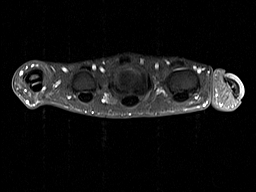
[im 18/44]
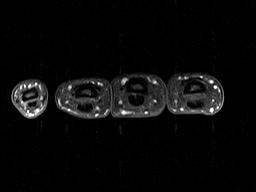
[im 26/44]
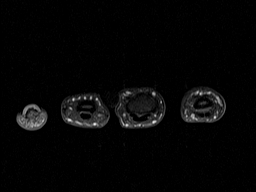
[im 35/44]
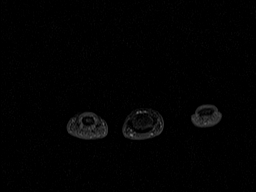
[im 44/44]
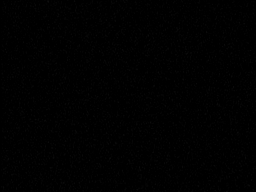

[40 of 40 positions shown; findings below may reference images not displayed]

FINDINGS: Bones/Joint/Cartilage

No marrow signal abnormality. No fracture or dislocation. Normal
alignment. No joint effusion.

Ligaments

Collateral ligaments are intact.

Muscles and Tendons
Flexor and extensor compartment tendons are intact. No muscle edema
or atrophy.

Soft tissue
Along the ulnar aspect of the third middle phalanx base, there is a
well-defined 6 x 4 x 7 mm T2 hyperintense, T1 hypointense mass with
solid homogeneous enhancement. There is no surrounding soft tissue
swelling.
IMPRESSION: 1. 6 x 4 x 7 mm solid mass along the ulnar aspect of the third
middle phalanx base, nonspecific. Differential considerations
include a peripheral nerve sheath tumor, glomus tumor, or
hemangioma.

## 2021-03-20 ENCOUNTER — Telehealth: Payer: Self-pay | Admitting: Medical

## 2021-03-20 ENCOUNTER — Telehealth: Payer: BC Managed Care – PPO | Admitting: Medical

## 2021-03-20 ENCOUNTER — Other Ambulatory Visit: Payer: Self-pay | Admitting: Medical

## 2021-03-20 ENCOUNTER — Encounter: Payer: Self-pay | Admitting: Medical

## 2021-03-20 VITALS — Wt 186.0 lb

## 2021-03-20 DIAGNOSIS — U071 COVID-19: Secondary | ICD-10-CM

## 2021-03-20 DIAGNOSIS — R051 Acute cough: Secondary | ICD-10-CM

## 2021-03-20 MED ORDER — HYDROCOD POLST-CPM POLST ER 10-8 MG/5ML PO SUER: 5.0000 mL | Freq: Two times a day (BID) | ORAL | 0 refills | Status: DC

## 2021-03-20 NOTE — Telephone Encounter (Signed)
Pharmacy called and they are out of stock of the tussionex they want to know if you can resend it to the [facility] [address], [place], [place] [address] She states that have it at that location

## 2021-03-20 NOTE — Progress Notes (Signed)
Subjective:     Patient ID: Derek Cowan, male   DOB: 1971/01/11, 50 y.o.   MRN: [id number]  This visit type was conducted due to national recommendations for restrictions regarding the COVID-19 Pandemic (e.g. social distancing) in an effort to limit this patient's exposure and mitigate transmission in our community.  Due to their co-morbid illnesses, this patient is at least at moderate risk for complications without adequate follow up.  This format is felt to be most appropriate for this patient at this time.    Documentation for virtual audio and video telecommunications through [place] encounter:  The patient was located at home. The provider was located in the office. The patient did consent to this visit and is aware of possible charges through their insurance for this visit.  The other persons participating in this telemedicine service were none. Time spent on call was 20 minutes and in review of previous records 20 minutes total.  This virtual service is not related to other E/M service within previous 7 days.    HPI Chief Complaint  Patient presents with   Covid Positive    Positive Home test this Monday 03/18/21   Cough    With nasal congestion since last Monday night   Virtual consult for covid.   Tested positive 2 days ago, but been sick since Monday 9 days ago.   Feels better than last week, but still coughing, still some phlegm.  Still has head and nasal congestion.  Using some robitussin DM.  No prior covid.   No fever.  Had chills and body aches, but not now.  No SOB, maybe some wheezing last week.  No NVD.  Had sore throat but resolved last week.  No change in taste or smell.   Has some fatigue improvement but had a lot of fatigue last week.  Has had some headache but improved.  No other aggravating or relieving factors. No other complaint.   Past Medical History:  Diagnosis Date   Eczema    Impaired fasting blood sugar    Vitamin D deficiency    Current  Outpatient Medications on File Prior to Visit  Medication Sig Dispense Refill   cholecalciferol (VITAMIN D3) 25 MCG (1000 UNIT) tablet Take 1 tablet (1,000 Units total) by mouth daily. 90 tablet 3   No current facility-administered medications on file prior to visit.     Review of Systems As in subjective    Objective:   Physical Exam Due to coronavirus pandemic stay at home measures, patient visit was virtual and they were not examined in person.   Wt 186 lb (84.4 kg)    BMI 28.07 kg/m   Gen: wd, wn,nad No labored breathing, no wheezing       Assessment:     Encounter Diagnoses  Name Primary?   COVID-19 virus infection Yes   Acute cough         Plan:     Overall improving from COVID infection.  He is on day 8 since illness began.  We discussed that the residual cough and fatigue can last a few weeks.  He has much improved from last week.  Advise continue rest and hydration, gradual return to activity.  Can use Tylenol as needed.  Can continue Robitussin-DM a few more days for cough and congestion.  Can use the Tussionex cough in the evening or at bedtime the next few days.  Advised that he continue quarantine but wear a mask for the next 5 days  when out in public or around others.  Symptoms should gradually improve over the next 1 to 2 weeks but his primary symptoms are much improved from last week  Clark was seen today for covid positive and cough.  Diagnoses and all orders for this visit:  COVID-19 virus infection  Acute cough  Other orders -     chlorpheniramine-HYDROcodone (TUSSIONEX PENNKINETIC ER) 10-8 MG/5ML SUER; Take 5 mLs by mouth 2 (two) times daily.  F/ u prn

## 2021-05-29 ENCOUNTER — Other Ambulatory Visit: Payer: Self-pay

## 2021-05-29 ENCOUNTER — Encounter: Payer: Self-pay | Admitting: Medical

## 2021-05-29 ENCOUNTER — Telehealth (INDEPENDENT_AMBULATORY_CARE_PROVIDER_SITE_OTHER): Payer: BC Managed Care – PPO | Admitting: Medical

## 2021-05-29 VITALS — Wt 187.0 lb

## 2021-05-29 DIAGNOSIS — R197 Diarrhea, unspecified: Secondary | ICD-10-CM

## 2021-05-29 NOTE — Progress Notes (Signed)
?Subjective:  ?  ? Patient ID: Derek Cowan, male   DOB: [birth year], 51 y.o.   MRN: [id number] ? ?This visit type was conducted due to national recommendations for restrictions regarding the COVID-19 Pandemic (e.g. social distancing) in an effort to limit this patient's exposure and mitigate transmission in our community.  Due to their co-morbid illnesses, this patient is at least at moderate risk for complications without adequate follow up.  This format is felt to be most appropriate for this patient at this time.   ? ?Documentation for virtual audio and video telecommunications through [place] [facility] encounter: ? ?The patient was located at home. ?The provider was located in the office. ?The patient did consent to this visit and is aware of possible charges through their insurance for this visit. ? ?The other persons participating in this telemedicine service were none. ?Time spent on call was 20 minutes and in review of previous records 20 minutes total. ? ?This virtual service is not related to other E/M service within previous 7 days. ? ? ?HPI ?Chief Complaint  ?Patient presents with  ? Diarrhea  ?  Diarrhea since last week. Nausea with it too  ? ?Virtual consult for illness.  On his birthday weekend on [birth year] weekend he was feeling fine.  He and his friends had went to several restaurants over the course of that weekend.  On February 27 he started getting loose stools, this occurred for 2 days with numerous loose stools throughout the day all day long Monday and Tuesday.  Then he had a few days of calm.  Then by the end of the week he was actually having problems not having a bowel movement until past Friday when he seemed constipated all day.  Despite trying several things only thing that helped was an enema over-the-counter.  Things settle down again for a day or so then earlier this week started getting loose stools again nonstop. ? ?No fever, no blood in the stool, no mucus in the stool.  No unusual  colors of the stool.  He did have some vomiting and nausea about a week ago but none in recent days. ? ?His friend that he went out with last weekend called him earlier last week to describe they were having similar symptoms of food poisoning with vomiting and diarrhea ? ?He has no other sick contacts.  No recent travel.  No recent camping.  No new animal exposure. ? ?No jaundice, no pain of his abdomen or back or other ? ?He has missed work including last Monday and Tuesday half a day this Monday and then Tuesday through today so far, Wednesday. ? ?Review of Systems ?As in subjective ?   ?Objective:  ? Physical Exam ?Due to coronavirus pandemic stay at home measures, patient visit was virtual and they were not examined in person.   ?Gen: wd, wn, nad ?Answers questions appropriately, not particularly ill-appearing ? ?   ?Assessment:  ?   ?Encounter Diagnosis  ?Name Primary?  ? Diarrhea, unspecified type Yes  ? ? ?   ?Plan:  ?   ?We discussed symptoms and concerns.  I suspect toxin from food poisoning or gastroenteritis from illness.  Continue good hydration, avoid acidic foods or large portions.  We discussed using more of a bland small portion brat diet currently, focus on good water intake and fluid intake.  Begin probiotic over-the-counter.  Within the next 2 days if he does not see some improvement he will call back to schedule to  come in for stool test and labs including CBC, TSH and stool profile ? ?He can use Pepto-Bismol as needed.  Avoid Imodium for now. ? ?If any new change such as fever, blood in stool or severe pain then come back right away for reevaluation ? ?Rennie was seen today for diarrhea. ? ?Diagnoses and all orders for this visit: ? ?Diarrhea, unspecified type ? ? ? ?F/u prn ? ?

## 2021-11-27 ENCOUNTER — Encounter: Payer: Self-pay | Admitting: Internal Medicine

## 2022-02-12 ENCOUNTER — Ambulatory Visit: Payer: BC Managed Care – PPO | Admitting: Medical

## 2022-02-12 VITALS — BP 120/80 | HR 80 | Wt 197.0 lb

## 2022-02-12 DIAGNOSIS — M79604 Pain in right leg: Secondary | ICD-10-CM

## 2022-02-12 DIAGNOSIS — M79605 Pain in left leg: Secondary | ICD-10-CM

## 2022-02-12 DIAGNOSIS — M5410 Radiculopathy, site unspecified: Secondary | ICD-10-CM | POA: Diagnosis not present

## 2022-02-12 DIAGNOSIS — R21 Rash and other nonspecific skin eruption: Secondary | ICD-10-CM | POA: Diagnosis not present

## 2022-02-12 DIAGNOSIS — G8929 Other chronic pain: Secondary | ICD-10-CM

## 2022-02-12 MED ORDER — CLOTRIMAZOLE-BETAMETHASONE 1-0.05 % EX CREA: 1.0000 | TOPICAL_CREAM | Freq: Every day | CUTANEOUS | 0 refills | Status: DC
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
## 2022-02-12 NOTE — Patient Instructions (Signed)
Please go to [facility] for your lumbar spine xray.   Their hours are 8am - 4:30 pm Monday - Friday.  Take your insurance card with you.  [facility] [phone]  [address], [address] [place], [place] [address]  [address] [place],  [address] 

## 2022-05-23 ENCOUNTER — Ambulatory Visit: Payer: BC Managed Care – PPO | Admitting: Medical

## 2022-05-23 VITALS — BP 130/80 | HR 80 | Temp 97.3°F | Wt 194.8 lb

## 2022-05-23 DIAGNOSIS — L309 Dermatitis, unspecified: Secondary | ICD-10-CM | POA: Diagnosis not present

## 2022-05-23 DIAGNOSIS — I8393 Asymptomatic varicose veins of bilateral lower extremities: Secondary | ICD-10-CM

## 2022-05-23 DIAGNOSIS — I872 Venous insufficiency (chronic) (peripheral): Secondary | ICD-10-CM | POA: Diagnosis not present

## 2022-05-23 DIAGNOSIS — R7303 Prediabetes: Secondary | ICD-10-CM | POA: Diagnosis not present

## 2022-05-23 DIAGNOSIS — R21 Rash and other nonspecific skin eruption: Secondary | ICD-10-CM

## 2022-05-23 MED ORDER — BETAMETHASONE VALERATE 0.1 % EX OINT: 1.0000 | TOPICAL_OINTMENT | Freq: Two times a day (BID) | CUTANEOUS | 0 refills | Status: DC

## 2022-05-23 NOTE — Progress Notes (Signed)
Subjective:  Derek Cowan is a 52 y.o. male who presents for Chief Complaint  Patient presents with   itchy legs    Itchy legs, x 2 weeks       Here for rash and itching on legs, right leg worse, right forearm, low back.   Was here a few months ago for some similar rash.  The cream at that time helped some, but the rash doesn't go completely away.  Currently using Cerave  lotion, benadryl anti itch cream, calamine lotion.  No other aggravating or relieving factors.    No other c/o.  The following portions of the patient's history were reviewed and updated as appropriate: allergies, current medications, past family history, past medical history, past social history, past surgical history and problem list.  ROS Otherwise as in subjective above  Objective: BP 130/80   Pulse 80   Temp (!) 97.3 F (36.3 C)   Wt 194 lb 12.8 oz (88.4 kg)   BMI 29.40 kg/m   General appearance: alert, no distress, well developed, well nourished Pulses: 2+ radial pulses, 2+ pedal pulses, normal cap refill Ext: no edema, mild varicose veins bilat legs, worse right lower leg Skin: scattered patches of rough skin without erythema or distinct patter, ranging anywhere from 38m to 1cm diameter including right lower lateral leg, 2 patches right posterior forearm, 3 patches low back, nonspecific appearing    Assessment: Encounter Diagnoses  Name Primary?   Dermatitis Yes   Varicose veins of both lower extremities, unspecified whether complicated    Venous insufficiency    Prediabetes    Rash      Plan: Discussed findings ,possible causes.  Irritated rough patches of skin, dermatitis.  Risk factors include varicose veins, venous insufficiency, prediabetes.  Counseled on venous insufficiency and varicose veins, prediabetes.   Recommendations: Try to use either compression hose or compression socks OTC daily since you have varicose veins and some venous insufficieny of legs Use a daily  moisturizing lotion such as Cerave, Lubriderm, Aquaphor, Cetaphil Begin Betamethasone ointment to the rash for the next 5 days, then use as needed I have placed referral to dermatology as well since this rash is recurrent Consider hypoallergenic soap and detergent Consider oatmeal bath periodically   BDallinwas seen today for itchy legs.  Diagnoses and all orders for this visit:  Dermatitis -     Ambulatory referral to Dermatology  Varicose veins of both lower extremities, unspecified whether complicated  Venous insufficiency  Prediabetes  Rash -     Ambulatory referral to Dermatology  Other orders -     betamethasone valerate ointment (VALISONE) 0.1 %; Apply 1 Application topically 2 (two) times daily.    Follow up: return in in April for well visit and fasting labs as planned

## 2022-05-23 NOTE — Patient Instructions (Addendum)
Recommendations: Try to use either compression hose or compression socks OTC daily since you have varicose veins and some venous insufficieny of legs Use a daily moisturizing lotion such as Cerave, Lubriderm, Aquaphor, Cetaphil Begin Betamethasone ointment to the rash for the next 5 days, then use as needed I have placed referral to deramtology as well since this rash is recurrent Consider hypoallergenic soap and detergent Consider oatmeal bath periodically   Chronic Venous Insufficiency Chronic venous insufficiency is a condition where the leg veins cannot effectively pump blood from the legs to the heart. This happens when the vein walls are either stretched, weakened, or damaged, or when the valves inside the vein are damaged. With the right treatment, you should be able to continue with an active life. This condition is also called venous stasis. What are the causes? Common causes of this condition include: High blood pressure inside the veins (venous hypertension). Sitting or standing too long, causing increased blood pressure in the leg veins. A blood clot that blocks blood flow in a vein (deep vein thrombosis, DVT). Inflammation of a vein (phlebitis) that causes a blood clot to form. Tumors in the pelvis that cause blood to back up. What increases the risk? The following factors may make you more likely to develop this condition: Having a family history of this condition. Obesity. Pregnancy. Living without enough regular physical activity or exercise (sedentary lifestyle). Smoking. Having a job that requires long periods of standing or sitting in one place. Being a certain age. Women in their 34s and 59s and men in their 39s are more likely to develop this condition. What are the signs or symptoms? Symptoms of this condition include: Veins that are enlarged, bulging, or twisted (varicose veins). Skin breakdown or ulcers. Reddened skin or dark discoloration of skin on the leg  between the knee and ankle. Brown, smooth, tight, and painful skin just above the ankle, usually on the inside of the leg (lipodermatosclerosis). Swelling of the legs. How is this diagnosed? This condition may be diagnosed based on: Your medical history. A physical exam. Tests, such as: A procedure that creates an image of a blood vessel and nearby organs and provides information about blood flow through the blood vessel (duplex ultrasound). A procedure that tests blood flow (plethysmography). A procedure that looks at the veins using X-ray and dye (venogram). How is this treated? The goals of treatment are to help you return to an active life and to minimize pain or disability. Treatment depends on the severity of your condition, and it may include: Wearing compression stockings. These can help relieve symptoms and help prevent your condition from getting worse. However, they do not cure the condition. Sclerotherapy. This procedure involves an injection of a solution that shrinks damaged veins. Surgery. This may involve: Removing a diseased vein (vein stripping). Cutting off blood flow through the vein (laser ablation surgery). Repairing or reconstructing a valve within the affected vein. Follow these instructions at home:     Wear compression stockings as told by your health care provider. These stockings help to prevent blood clots and reduce swelling in your legs. Take over-the-counter and prescription medicines only as told by your health care provider. Stay active by exercising, walking, or doing different activities. Ask your health care provider what activities are safe for you and how much exercise you need. Drink enough fluid to keep your urine pale yellow. Do not use any products that contain nicotine or tobacco, such as cigarettes, e-cigarettes, and chewing tobacco.  If you need help quitting, ask your health care provider. Keep all follow-up visits as told by your health care  provider. This is important. Contact a health care provider if you: Have redness, swelling, or more pain in the affected area. See a red streak or line that goes up or down from the affected area. Have skin breakdown or skin loss in the affected area, even if the breakdown is small. Get an injury in the affected area. Get help right away if: You get an injury and an open wound in the affected area. You have: Severe pain that does not get better with medicine. Sudden numbness or weakness in the foot or ankle below the affected area. Trouble moving your foot or ankle. A fever. Worse or persistent symptoms. Chest pain. Shortness of breath. Summary Chronic venous insufficiency is a condition where the leg veins cannot effectively pump blood from the legs to the heart. Chronic venous insufficiency occurs when the vein walls become stretched, weakened, or damaged, or when valves within the vein are damaged. Treatment depends on how severe your condition is. It often involves wearing compression stockings and may involve having a procedure. Make sure you stay active by exercising, walking, or doing different activities. Ask your health care provider what activities are safe for you and how much exercise you need. This information is not intended to replace advice given to you by your health care provider. Make sure you discuss any questions you have with your health care provider. Document Revised: 05/21/2020 Document Reviewed: 05/22/2020 Elsevier Patient Education  [place].     Prediabetes means you have a higher than normal blood sugar level. It's not high enough to be considered type 2 diabetes yet, but without making some lifestyle changes you are more likely to develop type 2 diabetes.  If you have prediabetes, the long-term damage of diabetes, especially to your heart, blood vessels and kidneys may already be starting. You may not be able to change certain risk factors such as  age, race, or family history, but you CAN make changes to your lifestyle, your eating habits, and your activity.  Although diabetes can develop at any age, the risk of prediabetes increases after age 17.  Your risk of prediabetes increases if you have a parent or sibling with type 2 diabetes.   Although it's unclear why, certain people including Black, Hispanic, American [place] American people, are more likely to develop prediabetes.  Ways to prevent or slow progression to diabetes: Eat healthy foods - Eating red meat and processed meat, and drinking sugar-sweetened beverages, is associated with a higher risk of prediabetes. A diet high in fruits, vegetables, nuts, whole grains and olive oil is associated with a lower risk of prediabetes. Get at least 150 minutes of moderate aerobic physical activity a week, or about 30 minutes on most days of the week.  The less active you are, the greater your risk of prediabetes. Physical activity helps you control your weight, uses up sugar for energy and makes the body use insulin more effectively. Lose excess weight - Being overweight is a primary risk factor for prediabetes. The more fatty tissue you have, especially inside and between the muscle and skin around your abdomen, the more resistant your cells become to insulin. Waist size. A large waist size can indicate insulin resistance. The risk of insulin resistance goes up for men with waists larger than 40 inches and for women with waists larger than 35 inches. Control  your blood pressure and cholesterol.  If your blood pressure is not 130/80 or less, discuss with your provider to help get this under control.  It is ideal to have an HDL good cholesterol number >50 and have a LDL bad cholesterol number <100.   Don't smoke One simple strategy to help you make good food choices and eat appropriate portions sizes is to divide up your plate. These three divisions on your plate promote healthy  eating:  One-half: fruit and nonstarchy vegetables One-quarter: whole grains One-quarter: protein-rich foods, such as legumes, fish or lean meats

## 2022-06-30 ENCOUNTER — Ambulatory Visit
Admission: RE | Admit: 2022-06-30 | Discharge: 2022-06-30 | Disposition: A | Discharge status: Home / Self Care | Payer: Self-pay | Source: Ambulatory Visit | Attending: Medical | Admitting: Medical

## 2022-06-30 ENCOUNTER — Encounter: Payer: Self-pay | Admitting: Medical

## 2022-06-30 ENCOUNTER — Ambulatory Visit (INDEPENDENT_AMBULATORY_CARE_PROVIDER_SITE_OTHER): Payer: BC Managed Care – PPO | Admitting: Medical

## 2022-06-30 VITALS — BP 122/80 | HR 85 | Ht 69.0 in | Wt 188.8 lb

## 2022-06-30 DIAGNOSIS — L309 Dermatitis, unspecified: Secondary | ICD-10-CM

## 2022-06-30 DIAGNOSIS — Z125 Encounter for screening for malignant neoplasm of prostate: Secondary | ICD-10-CM

## 2022-06-30 DIAGNOSIS — G8929 Other chronic pain: Secondary | ICD-10-CM

## 2022-06-30 DIAGNOSIS — M25561 Pain in right knee: Secondary | ICD-10-CM

## 2022-06-30 DIAGNOSIS — R7301 Impaired fasting glucose: Secondary | ICD-10-CM | POA: Diagnosis not present

## 2022-06-30 DIAGNOSIS — Z0000 Encounter for general adult medical examination without abnormal findings: Secondary | ICD-10-CM

## 2022-06-30 DIAGNOSIS — M5410 Radiculopathy, site unspecified: Secondary | ICD-10-CM

## 2022-06-30 DIAGNOSIS — Z7185 Encounter for immunization safety counseling: Secondary | ICD-10-CM

## 2022-06-30 DIAGNOSIS — E559 Vitamin D deficiency, unspecified: Secondary | ICD-10-CM

## 2022-06-30 DIAGNOSIS — Z13220 Encounter for screening for lipoid disorders: Secondary | ICD-10-CM | POA: Diagnosis not present

## 2022-06-30 LAB — POCT URINALYSIS DIP (PROADVANTAGE DEVICE)
Bilirubin, UA: NEGATIVE
Blood, UA: NEGATIVE
Glucose, UA: NEGATIVE mg/dL
Leukocytes, UA: NEGATIVE
Nitrite, UA: NEGATIVE
Protein Ur, POC: NEGATIVE mg/dL
Specific Gravity, Urine: 1.025
Urobilinogen, Ur: NEGATIVE
pH, UA: 6 (ref 5.0–8.0)

## 2022-06-30 LAB — LIPID PANEL

## 2022-06-30 NOTE — Patient Instructions (Signed)
Please go to [facility] for your lumbar back xray.   Their hours are 8am - 4:30 pm Monday - Friday.  Take your insurance card with you.  [facility] [phone]   [address] [place], [place] [address]

## 2022-06-30 NOTE — Progress Notes (Signed)
Subjective:   HPI  Derek Cowan is a 52 y.o. male who presents for Chief Complaint  Patient presents with   fasting cpe     Fasting cpe, having some skin issues- seen dermatology     Patient Care Team: Sherwood Castilla, Cleda Mccreedy as PCP - General (Family Medicine) Sees dentist Sees eye doctor Dr. Dominica Severin and Dr. Lavada Mesi, orthopedics Dermatology   Concerns: I saw him a few weeks ago and he had concerns about his skin.  We made a referral to dermatology.  He was using the cream I prescribed but they gave him a different cream but he is confused about what he supposed to use.  Dermatology also gave him multiple lotion samples.  He was having rough rounds patches on his legs bilaterally  He continues to have some pains in his back and legs including right knee.  Throbbing pains in his legs, no numbness or tingling.  No weakness.  Reviewed their medical, surgical, family, social, medication, and allergy history and updated chart as appropriate.  Past Medical History:  Diagnosis Date   Eczema    Impaired fasting blood sugar    Vitamin D deficiency     Past Surgical History:  Procedure Laterality Date   HAND SURGERY  2021   tumor of left hand, benign    Family History  Problem Relation Age of Onset   Lupus Mother    Lung disease Mother        ARDS   Hypertension Mother    Diabetes Father    Prostatitis Father    Arrhythmia Sister    Miscarriages / Stillbirths Sister    Hypertension Sister    Heart disease Paternal Aunt    Cancer Paternal Grandfather        lung   Stroke Neg Hx      Current Outpatient Medications:    betamethasone valerate ointment (VALISONE) 0.1 %, Apply 1 Application topically 2 (two) times daily., Disp: 45 g, Rfl: 0   cholecalciferol (VITAMIN D3) 25 MCG (1000 UNIT) tablet, Take 1 tablet (1,000 Units total) by mouth daily., Disp: 90 tablet, Rfl: 3   Crisaborole (EUCRISA) 2 % OINT, APPLY TOPICALLY ONCE DAILY, Disp: , Rfl:     fluocinonide cream (LIDEX) 0.05 %, Apply topically., Disp: , Rfl:   Allergies  Allergen Reactions   Lactose Intolerance (Gi)     Review of Systems  Constitutional:  Negative for chills, fever, malaise/fatigue and weight loss.  HENT:  Negative for congestion, ear pain, hearing loss, sore throat and tinnitus.   Eyes:  Negative for blurred vision, pain and redness.  Respiratory:  Negative for cough, hemoptysis and shortness of breath.   Cardiovascular:  Negative for chest pain, palpitations, orthopnea, claudication and leg swelling.  Gastrointestinal:  Negative for abdominal pain, blood in stool, constipation, diarrhea, nausea and vomiting.  Genitourinary:  Negative for dysuria, flank pain, frequency, hematuria and urgency.  Musculoskeletal:  Positive for back pain, joint pain and myalgias. Negative for falls.  Skin:  Positive for itching and rash.  Neurological:  Negative for dizziness, tingling, speech change, weakness and headaches.  Endo/Heme/Allergies:  Negative for polydipsia. Does not bruise/bleed easily.  Psychiatric/Behavioral:  Negative for depression and memory loss. The patient is not nervous/anxious and does not have insomnia.         06/30/2022    2:11 PM 02/12/2022    2:40 PM 05/29/2021   12:14 PM 11/20/2020    9:32 AM 11/18/2019    2:36  PM  Depression screen PHQ 2/9  Decreased Interest 0 0 0 0 0  Down, Depressed, Hopeless 0 0 0 0 0  PHQ - 2 Score 0 0 0 0 0        Objective:  BP 122/80   Pulse 85   Ht 5\' 9"  (1.753 m)   Wt 188 lb 12.8 oz (85.6 kg)   BMI 27.88 kg/m   BP Readings from Last 3 Encounters:  06/30/22 122/80  05/23/22 130/80  02/12/22 120/80   Wt Readings from Last 3 Encounters:  06/30/22 188 lb 12.8 oz (85.6 kg)  05/23/22 194 lb 12.8 oz (88.4 kg)  02/12/22 197 lb (89.4 kg)    General appearance: alert, no distress, WD/WN, African American male Skin: Several benign skin tags, 3 on anterior neck, 1 on posterior neck, 1 on the right lateral chest  wall, others on chest wall.  Mildly rough patch of skin of left calf posteriorly there, right anterior leg just below the knee, the coloration and rash he had a few weeks ago that has mostly cleared up , no worrisome lesions otherwise HEENT: normocephalic, conjunctiva/corneas normal, sclerae anicteric, PERRLA, EOMi, nares patent, no discharge or erythema, pharynx normal Oral cavity: MMM, tongue normal, teeth normal Neck: supple, no lymphadenopathy, no thyromegaly, no masses, normal ROM, no bruits Chest: non tender, normal shape and expansion Heart: RRR, normal S1, S2, no murmurs Lungs: CTA bilaterally, no wheezes, rhonchi, or rales Abdomen: +bs, soft, non tender, non distended, no masses, no hepatomegaly, no splenomegaly, no bruits Back: non tender, normal ROM, no scoliosis Musculoskeletal: upper extremities non tender, no obvious deformity, normal ROM throughout, lower extremities non tender, no obvious deformity, normal ROM throughout Extremities: Mild varicose veins of lower legs bilaterally, no edema, no cyanosis, no clubbing Pulses: 2+ symmetric, upper and lower extremities, normal cap refill Neurological: alert, oriented x 3, CN2-12 intact, strength normal upper extremities and lower extremities, sensation normal throughout, DTRs 2+ throughout, no cerebellar signs, gait normal Psychiatric: normal affect, behavior normal, pleasant  GU: normal male external genitalia,circumcised, nontender, no masses, no hernia, no lymphadenopathy Rectal: declined   Assessment and Plan :   Encounter Diagnoses  Name Primary?   Encounter for health maintenance examination in adult Yes   Impaired fasting blood sugar    Screening for prostate cancer    Screening for lipid disorders    Eczema, unspecified type    Vitamin D deficiency    Radicular leg pain    Vaccine counseling    Chronic pain of right knee      This visit was a preventative care visit, also known as wellness visit or routine  physical.   Topics typically include healthy lifestyle, diet, exercise, preventative care, vaccinations, sick and well care, proper use of emergency dept and after hours care, as well as other concerns.     Recommendations: Continue to return yearly for your annual wellness and preventative care visits.  This gives [place] a chance to discuss healthy lifestyle, exercise, vaccinations, review your chart record, and perform screenings where appropriate.  I recommend you see your eye doctor yearly for routine vision care.  I recommend you see your dentist yearly for routine dental care including hygiene visits twice yearly.   Vaccination recommendations were reviewed Immunization History  Administered Date(s) Administered   Hepatitis B, ADULT 11/10/2014   Influenza,inj,Quad PF,6+ Mos 11/18/2019, 11/20/2020   Influenza-Unspecified 12/24/2021   Moderna Covid-19 Vaccine Bivalent Booster 3yrs & up 12/24/2021   PFIZER(Purple Top)SARS-COV-2 Vaccination 05/27/2019,  06/17/2019, 02/15/2020, 11/07/2020   Tdap 11/08/2014, 11/18/2019   Consider shingles vaccine  Screening for cancer: Colon cancer screening: 02/2021 Cologuard negative  We discussed PSA, prostate exam, and prostate cancer screening risks/benefits.   PSA lab today  Skin cancer screening: Check your skin regularly for new changes, growing lesions, or other lesions of concern Come in for evaluation if you have skin lesions of concern.  Lung cancer screening: If you have a greater than 20 pack year history of tobacco use, then you may qualify for lung cancer screening with a chest CT scan.   Please call your insurance company to inquire about coverage for this test.  We currently don't have screenings for other cancers besides breast, cervical, colon, and lung cancers.  If you have a strong family history of cancer or have other cancer screening concerns, please let me know.    Bone health: Get at least 150 minutes of aerobic exercise  weekly Get weight bearing exercise at least once weekly Bone density test:  A bone density test is an imaging test that uses a type of X-ray to measure the amount of calcium and other minerals in your bones. The test may be used to diagnose or screen you for a condition that causes weak or thin bones (osteoporosis), predict your risk for a broken bone (fracture), or determine how well your osteoporosis treatment is working. The bone density test is recommended for females 65 and older, or females or males <65 if certain risk factors such as thyroid disease, long term use of steroids such as for asthma or rheumatological issues, vitamin D deficiency, estrogen deficiency, family history of osteoporosis, self or family history of fragility fracture in first degree relative.    Heart health: Get at least 150 minutes of aerobic exercise weekly Limit alcohol It is important to maintain a healthy blood pressure and healthy cholesterol numbers  Heart disease screening: Screening for heart disease includes screening for blood pressure, fasting lipids, glucose/diabetes screening, BMI height to weight ratio, reviewed of smoking status, physical activity, and diet.    Goals include blood pressure 120/80 or less, maintaining a healthy lipid/cholesterol profile, preventing diabetes or keeping diabetes numbers under good control, not smoking or using tobacco products, exercising most days per week or at least 150 minutes per week of exercise, and eating healthy variety of fruits and vegetables, healthy oils, and avoiding unhealthy food choices like fried food, fast food, high sugar and high cholesterol foods.    Other tests may possibly include EKG test, CT coronary calcium score, echocardiogram, exercise treadmill stress test.    Medical care options: I recommend you continue to seek care here first for routine care.  We try really hard to have available appointments Monday through Friday daytime hours for  sick visits, acute visits, and physicals.  Urgent care should be used for after hours and weekends for significant issues that cannot wait till the next day.  The emergency department should be used for significant potentially life-threatening emergencies.  The emergency department is expensive, can often have long wait times for less significant concerns, so try to utilize primary care, urgent care, or telemedicine when possible to avoid unnecessary trips to the emergency department.  Virtual visits and telemedicine have been introduced since the pandemic started in 2020, and can be convenient ways to receive medical care.  We offer virtual appointments as well to assist you in a variety of options to seek medical care.   Separate significant issues discussed: Impaired  fasting glucose -updated labs today  Varicose veins -regular exercise helps as well as compression hose  Back, leg, and knee pain - go for baseline right knee and lumbar spine  Rash - I clarified proper use of creams and lotions.  Discussed that the lotions for daily moisturizing use.  Discussed that either of the steroid creams prescribed recently, 1 by me, 1 by dermatology can be used as dermatology discussed but not simultaneously.  Dermatology advised steroid cream twice a day for a week then every other week as needed use.   Thurman was seen today for fasting cpe .  Diagnoses and all orders for this visit:  Encounter for health maintenance examination in adult -     Comprehensive metabolic panel -     CBC -     VITAMIN D 25 Hydroxy (Vit-D Deficiency, Fractures) -     Lipid panel -     POCT Urinalysis DIP (Proadvantage Device) -     Hemoglobin A1c -     DG Lumbar Spine Complete; Future -     PSA  Impaired fasting blood sugar -     Hemoglobin A1c  Screening for prostate cancer -     PSA  Screening for lipid disorders -     Lipid panel  Eczema, unspecified type  Vitamin D deficiency -     VITAMIN D 25 Hydroxy  (Vit-D Deficiency, Fractures)  Radicular leg pain -     DG Lumbar Spine Complete; Future -     DG Knee Complete 4 Views Right; Future  Vaccine counseling  Chronic pain of right knee -     DG Knee Complete 4 Views Right; Future   Follow-up pending labs, yearly for physical

## 2022-07-01 ENCOUNTER — Other Ambulatory Visit: Payer: Self-pay | Admitting: Internal Medicine

## 2022-07-01 DIAGNOSIS — G8929 Other chronic pain: Secondary | ICD-10-CM

## 2022-07-01 DIAGNOSIS — M545 Low back pain, unspecified: Secondary | ICD-10-CM

## 2022-07-01 LAB — LIPID PANEL
Chol/HDL Ratio: 3.2 ratio (ref 0.0–5.0)
Cholesterol, Total: 213 mg/dL — ABNORMAL HIGH (ref 100–199)
HDL: 67 mg/dL (ref >39)
LDL Chol Calc (NIH): 134 mg/dL — ABNORMAL HIGH (ref 0–99)
Triglycerides: 68 mg/dL (ref 0–149)
VLDL Cholesterol Cal: 12 mg/dL (ref 5–40)

## 2022-07-01 LAB — CBC
Hematocrit: 43.8 % (ref 37.5–51.0)
Hemoglobin: 14.6 g/dL (ref 13.0–17.7)
MCH: 28.5 pg (ref 26.6–33.0)
MCHC: 33.3 g/dL (ref 31.5–35.7)
MCV: 85 fL (ref 79–97)
Platelets: 258 10*3/uL (ref 150–450)
RBC: 5.13 x10E6/uL (ref 4.14–5.80)
RDW: 13.1 % (ref 11.6–15.4)
WBC: 5.8 10*3/uL (ref 3.4–10.8)

## 2022-07-01 LAB — COMPREHENSIVE METABOLIC PANEL
ALT: 16 IU/L (ref 0–44)
AST: 16 IU/L (ref 0–40)
Albumin/Globulin Ratio: 1.6 (ref 1.2–2.2)
Albumin: 4.6 g/dL (ref 4.1–5.1)
Alkaline Phosphatase: 104 IU/L (ref 44–121)
BUN/Creatinine Ratio: 9 (ref 9–20)
BUN: 11 mg/dL (ref 6–24)
Bilirubin Total: 0.8 mg/dL (ref 0.0–1.2)
CO2: 25 mmol/L (ref 20–29)
Calcium: 9.8 mg/dL (ref 8.7–10.2)
Chloride: 102 mmol/L (ref 96–106)
Creatinine, Ser: 1.27 mg/dL (ref 0.76–1.27)
Globulin, Total: 2.8 g/dL (ref 1.5–4.5)
Glucose: 91 mg/dL (ref 70–99)
Potassium: 4.1 mmol/L (ref 3.5–5.2)
Sodium: 142 mmol/L (ref 134–144)
Total Protein: 7.4 g/dL (ref 6.0–8.5)
eGFR: 69 mL/min/{1.73_m2} (ref >59)

## 2022-07-01 LAB — HEMOGLOBIN A1C
Est. average glucose Bld gHb Est-mCnc: 117 mg/dL
Hgb A1c MFr Bld: 5.7 % — ABNORMAL HIGH (ref 4.8–5.6)

## 2022-07-01 LAB — PSA: Prostate Specific Ag, Serum: 1.2 ng/mL (ref 0.0–4.0)

## 2022-07-01 LAB — VITAMIN D 25 HYDROXY (VIT D DEFICIENCY, FRACTURES): Vit D, 25-Hydroxy: 37.2 ng/mL (ref 30.0–100.0)

## 2022-07-01 NOTE — Progress Notes (Signed)
Results sent through MyChart

## 2022-07-01 NOTE — Progress Notes (Signed)
X-ray of knees shows some connective tissue changes of the kneecap on the right but otherwise the knee x-ray is normal.  Back x-ray shows some minimal arthritis or degenerative changes /age-related changes.  I recommend a referral to physical therapy to do send specific therapy regarding the knee and potentially the back as well given the findings since you continue to have some pains periodically.  If agreeable we will make a referral

## 2022-07-01 NOTE — Addendum Note (Signed)
Addended by: Herminio Commons A on: 07/01/2022 11:17 AM   Modules accepted: Orders

## 2022-07-01 NOTE — Progress Notes (Signed)
Results sent through My Chart  There is a separate message about x-ray results from today as well

## 2022-09-01 ENCOUNTER — Telehealth: Payer: Self-pay | Admitting: Medical

## 2022-09-01 NOTE — Telephone Encounter (Signed)
Derek Cowan called and says he called his insurance and the shingle shot is covered as long as it is filed with a preventative and diagnostic code.

## 2022-09-05 ENCOUNTER — Other Ambulatory Visit: Payer: BC Managed Care – PPO

## 2022-09-05 DIAGNOSIS — Z23 Encounter for immunization: Secondary | ICD-10-CM

## 2022-09-27 ENCOUNTER — Telehealth: Payer: BC Managed Care – PPO | Admitting: Nurse Practitioner

## 2022-09-27 ENCOUNTER — Other Ambulatory Visit: Payer: Self-pay | Admitting: Nurse Practitioner

## 2022-09-27 DIAGNOSIS — Z20822 Contact with and (suspected) exposure to covid-19: Secondary | ICD-10-CM | POA: Diagnosis not present

## 2022-09-27 DIAGNOSIS — R058 Other specified cough: Secondary | ICD-10-CM | POA: Diagnosis not present

## 2022-09-27 MED ORDER — HYDROCOD POLI-CHLORPHE POLI ER 10-8 MG/5ML PO SUER: 5.0000 mL | Freq: Two times a day (BID) | ORAL | 0 refills | Status: DC | PRN

## 2022-09-27 NOTE — Patient Instructions (Signed)
  Derek Cowan, thank you for joining Derek Pierini, FNP for today's virtual visit.  While this provider is not your primary care provider (PCP), if your PCP is located in our provider database this encounter information will be shared with them immediately following your visit.   A [facility] MyChart account gives you access to today's visit and all your visits, tests, and labs performed at [facility] " click here if you don't have a [facility] MyChart account or go to [email or web address]  Consent: (Patient) Derek Cowan provided verbal consent for this virtual visit at the beginning of the encounter.  Current Medications:  Current Outpatient Medications:    chlorpheniramine-HYDROcodone (TUSSIONEX) 10-8 MG/5ML, Take 5 mLs by mouth every 12 (twelve) hours as needed for cough., Disp: 120 mL, Rfl: 0   betamethasone valerate ointment (VALISONE) 0.1 %, Apply 1 Application topically 2 (two) times daily., Disp: 45 g, Rfl: 0   cholecalciferol (VITAMIN D3) 25 MCG (1000 UNIT) tablet, Take 1 tablet (1,000 Units total) by mouth daily., Disp: 90 tablet, Rfl: 3   Crisaborole (EUCRISA) 2 % OINT, APPLY TOPICALLY ONCE DAILY, Disp: , Rfl:    fluocinonide cream (LIDEX) 0.05 %, Apply topically., Disp: , Rfl:    Medications ordered in this encounter:  Meds ordered this encounter  Medications   chlorpheniramine-HYDROcodone (TUSSIONEX) 10-8 MG/5ML    Sig: Take 5 mLs by mouth every 12 (twelve) hours as needed for cough.    Dispense:  120 mL    Refill:  0    Order Specific Question:   Supervising Provider    Answer:   Merrilee Jansky [[id number]]     *If you need refills on other medications prior to your next appointment, please contact your pharmacy*  Follow-Up: Call back or seek an in-person evaluation if the symptoms worsen or if the condition fails to improve as anticipated.  [facility] [phone]  Other Instructions 1. Take meds as prescribed 2.  Use a cool mist humidifier especially during the winter months and when heat has been humid. 3. Use saline nose sprays frequently 4. Saline irrigations of the nose can be very helpful if done frequently.  * 4X daily for 1 week*  * Use of a nettie pot can be helpful with this. Follow directions with this* 5. Drink plenty of fluids 6. Keep thermostat turn down low 7.For any cough or congestion- tussionex 8. For fever or aces or pains- take tylenol or ibuprofen appropriate for age and weight.  * for fevers greater than 101 orally you may alternate ibuprofen and tylenol every  3 hours.       If you have been instructed to have an in-person evaluation today at a local Urgent Care facility, please use the link below. It will take you to a list of all of our available [facility], including address, phone number and hours of operation. Please do not delay care.  [facility]  If you or a family member do not have a primary care provider, use the link below to schedule a visit and establish care. When you choose a [facility] primary care physician or advanced practice provider, you gain a long-term partner in health. Find a Primary Care Provider  Learn more about [facility]'s in-office and virtual care options: [facility] - Get Care Now

## 2022-09-27 NOTE — Progress Notes (Signed)
Virtual Visit Consent   Derek Cowan, you are scheduled for a virtual visit with Mary-Margaret Daphine Deutscher, FNP, a [facility] provider, today.     Just as with appointments in the office, your consent must be obtained to participate.  Your consent will be active for this visit and any virtual visit you may have with one of our providers in the next 365 days.     If you have a MyChart account, a copy of this consent can be sent to you electronically.  All virtual visits are billed to your insurance company just like a traditional visit in the office.    As this is a virtual visit, video technology does not allow for your provider to perform a traditional examination.  This may limit your provider's ability to fully assess your condition.  If your provider identifies any concerns that need to be evaluated in person or the need to arrange testing (such as labs, EKG, etc.), we will make arrangements to do so.     Although advances in technology are sophisticated, we cannot ensure that it will always work on either your end or our end.  If the connection with a video visit is poor, the visit may have to be switched to a telephone visit.  With either a video or telephone visit, we are not always able to ensure that we have a secure connection.     I need to obtain your verbal consent now.   Are you willing to proceed with your visit today? YES   Unice Felipe has provided verbal consent on 09/27/2022 for a virtual visit (video or telephone).   Mary-Margaret Daphine Deutscher, FNP   Date: 09/27/2022 2:01 PM   Virtual Visit via Video Note   I, Mary-Margaret Daphine Deutscher, connected with Mahin Dinsmoor ([id number], 19-Nov-1970) on 09/27/22 at  2:00 PM EDT by a video-enabled telemedicine application and verified that I am speaking with the correct person using two identifiers.  Location: Patient: Virtual Visit Location Patient: Home Provider: Virtual Visit Location Provider: Mobile   I discussed the limitations of  evaluation and management by telemedicine and the availability of in person appointments. The patient expressed understanding and agreed to proceed.    History of Present Illness: Derek Cowan is a 52 y.o. who identifies as a male who was assigned male at birth, and is being seen today for cough from covid.  HPI: Tested positive for covid on Tuesday. He has had in the past. Has been treating with OTC meds and that has not helped. He was given tussionex I the past that helped his cough.    Review of Systems  Constitutional:  Negative for fever (resolved).  HENT:  Positive for congestion. Negative for sore throat.   Respiratory:  Positive for cough and sputum production. Negative for shortness of breath.     Problems:  Patient Active Problem List   Diagnosis Date Noted   Vitamin D deficiency 06/30/2022   Paresthesia 11/20/2020   Varicose veins of both lower extremities 11/20/2020   Skin tags, multiple acquired 11/20/2020   Hair loss 11/20/2020   Alkaline phosphatase elevation 03/02/2020   Impaired fasting blood sugar 03/02/2020   BMI 29.0-29.9,adult 03/02/2020   Encounter for health maintenance examination in adult 11/18/2019   Screening for lipid disorders 11/18/2019   Screening for prostate cancer 11/18/2019   Screen for colon cancer 11/18/2019   Screen for STD (sexually transmitted disease) 11/18/2019   Chronic nonintractable headache 09/28/2019   Snoring 09/28/2019  Ganglion cyst of finger of left hand 12/24/2018   Need for influenza vaccination 12/24/2018   Eczema 12/24/2018   Influenza vaccination declined 11/27/2017    Allergies:  Allergies  Allergen Reactions   Lactose Intolerance (Gi)    Medications:  Current Outpatient Medications:    betamethasone valerate ointment (VALISONE) 0.1 %, Apply 1 Application topically 2 (two) times daily., Disp: 45 g, Rfl: 0   cholecalciferol (VITAMIN D3) 25 MCG (1000 UNIT) tablet, Take 1 tablet (1,000 Units total) by mouth daily.,  Disp: 90 tablet, Rfl: 3   Crisaborole (EUCRISA) 2 % OINT, APPLY TOPICALLY ONCE DAILY, Disp: , Rfl:    fluocinonide cream (LIDEX) 0.05 %, Apply topically., Disp: , Rfl:   Observations/Objective: Patient is well-developed, well-nourished in no acute distress.  Resting comfortably  at home.  Head is normocephalic, atraumatic.  No labored breathing.  Speech is clear and coherent with logical content.  Patient is alert and oriented at baseline.    Assessment and Plan:  Tarion Slowey in today with chief complaint of Covid Positive   1. Cough with exposure to COVID-19 virus 1. Take meds as prescribed 2. Use a cool mist humidifier especially during the winter months and when heat has been humid. 3. Use saline nose sprays frequently 4. Saline irrigations of the nose can be very helpful if done frequently.  * 4X daily for 1 week*  * Use of a nettie pot can be helpful with this. Follow directions with this* 5. Drink plenty of fluids 6. Keep thermostat turn down low 7.For any cough or congestion- tussionex 8. For fever or aces or pains- take tylenol or ibuprofen appropriate for age and weight.  * for fevers greater than 101 orally you may alternate ibuprofen and tylenol every  3 hours.   Meds ordered this encounter  Medications   chlorpheniramine-HYDROcodone (TUSSIONEX) 10-8 MG/5ML    Sig: Take 5 mLs by mouth every 12 (twelve) hours as needed for cough.    Dispense:  120 mL    Refill:  0    Order Specific Question:   Supervising Provider    Answer:   Merrilee Jansky [[id number]]       Follow Up Instructions: I discussed the assessment and treatment plan with the patient. The patient was provided an opportunity to ask questions and all were answered. The patient agreed with the plan and demonstrated an understanding of the instructions.  A copy of instructions were sent to the patient via MyChart.  The patient was advised to call back or seek an in-person evaluation if the symptoms  worsen or if the condition fails to improve as anticipated.  Time:  I spent 6 minutes with the patient via telehealth technology discussing the above problems/concerns.    Mary-Margaret Daphine Deutscher, FNP

## 2022-10-17 ENCOUNTER — Other Ambulatory Visit: Payer: BC Managed Care – PPO

## 2022-10-29 ENCOUNTER — Ambulatory Visit: Payer: BC Managed Care – PPO | Admitting: Family Medicine

## 2022-10-29 ENCOUNTER — Encounter: Payer: Self-pay | Admitting: Family Medicine

## 2022-10-29 VITALS — BP 124/84 | HR 68 | Ht 69.0 in | Wt 187.0 lb

## 2022-10-29 DIAGNOSIS — K219 Gastro-esophageal reflux disease without esophagitis: Secondary | ICD-10-CM | POA: Diagnosis not present

## 2022-10-29 DIAGNOSIS — R079 Chest pain, unspecified: Secondary | ICD-10-CM | POA: Diagnosis not present

## 2022-10-29 MED ORDER — OMEPRAZOLE 40 MG PO CPDR
40.0000 mg | DELAYED_RELEASE_CAPSULE | Freq: Every day | ORAL | 0 refills | Status: DC
Start: 2022-10-29 — End: 2023-10-01

## 2022-10-29 NOTE — Progress Notes (Signed)
Chief Complaint  Patient presents with   Gastroesophageal Reflux    Saturday was the worst. Tried Prilosec Friday. Took pepcid and alka seltzer as well and then tried some tylenol PM to sleep. Even tried baking soda in water. Not really helping.    8/1 or 8/2 he had a full burrito from [employer] (not spicy, usually eats only 1/2). On 8/2 he noticed burning in his mid-chest. He has h/o heartburn, gets it about every other week.  This was worse than usual On 8/3, after getting a haircut, he noticed more discomfort in mid-chest, hard to catch his breath. He picked up Pepcid from the store, and tylenol PM.  It helped only a little.  He still had pain on Sunday 8/4.  Talked to his cousin, who reminded him of reflux, and what their grandmother would use--baking soda concoction. This helped some. Monday morning was much better than it was on Saturday. But he still has some discomfort, occasional breathing issue (feels worse with a deep breath). No change in activity/exercise/weights.  Has been moving, lifting boxes, but had no symptoms related to the move (just leg issues).  This occurred once in the past, at his grandmother's funeral, went to ER and was diagnosed with GERD. Milder episodes since then.  Uses omeprazole as needed, a few times/month, or an occasional Tums.  Currently has some discomfort in chest--burny, discomfort with a deep breath. Improved, but still present.  He had COVID a month ago--breathing fully resolved, last of the cough was early last week. He has occasional allergies, occ cough/sneeze, hasn't been flaring.   PMH, PSH, SH reviewed Shane patient, pre-diabetic, otherwise healthy.  Last CPE 06/2022  Outpatient Encounter Medications as of 10/29/2022  Medication Sig Note   aspirin-sod bicarb-citric acid (ALKA-SELTZER) 325 MG TBEF tablet Take 325 mg by mouth every 6 (six) hours as needed. 10/29/2022: Took yesterday   cholecalciferol (VITAMIN D3) 25 MCG (1000 UNIT) tablet Take 1  tablet (1,000 Units total) by mouth daily.    Crisaborole (EUCRISA) 2 % OINT APPLY TOPICALLY ONCE DAILY 10/29/2022: daily   famotidine (PEPCID) 20 MG tablet Take 20 mg by mouth 2 (two) times daily. 10/29/2022: Took one last night   betamethasone valerate ointment (VALISONE) 0.1 % Apply 1 Application topically 2 (two) times daily. (Patient not taking: Reported on 10/29/2022) 10/29/2022: As needed   fluocinonide cream (LIDEX) 0.05 % Apply topically. (Patient not taking: Reported on 10/29/2022) 10/29/2022: As needed   [DISCONTINUED] chlorpheniramine-HYDROcodone (TUSSIONEX) 10-8 MG/5ML Take 5 mLs by mouth every 12 (twelve) hours as needed for cough.    No facility-administered encounter medications on file as of 10/29/2022.    ROS: no f/c, headaches, dizziness.  Mild/intermittent allergy symptoms. No nausea or vomiting.  Had some diarrhea after taking "baking soda concoction". No black or bloody stools, no abdominal pain. Denies dysphagia Chest pain with deep breaths; no exertional chest pain or shortness of breath prior to Saturday, other than with COVID. No edema. No bleeding, bruising rash    PHYSICAL EXAM:  BP 124/84   Pulse 68   Ht 5\' 9"  (1.753 m)   Wt 187 lb (84.8 kg)   BMI 27.62 kg/m   Well-appearing, pleasant male, in no distress HEENT: conjunctiva and sclera are clear, EOMI.  OP clear, nasal mucosa normal, sinuses nontender Neck: no lymphadenopathy, thyromegaly or bruit Heart: regular rate and rhythm Lungs: clear bilaterally Chest: nontender, no masses Abdomen: soft, nontender, no hepatosplenomegaly or mass.  Extremities: no edema Psych: normal mood, affect,  hygiene and grooming Neuro: alert and oriented, cranial nerves intact, normal gait, cranial nerves   EKG:  NSR, no acute changes     ASSESSMENT/PLAN:  Gastroesophageal reflux disease, unspecified whether esophagitis present - reflux precautions reviewed in detail. To use Rx PPI x 2-4 weeks, then can taper down to OTC and prn  use. f/u if sx persist or worsen. DDx reviewed - Plan: omeprazole (PRILOSEC) 40 MG capsule  Chest pain, unspecified type - unsure why GERD would trigger increased pain with breathing. Nontender on exam. Normal exam. f/u if persists/worsens - Plan: EKG 12-Lead  Reviewed red flag symptoms. Discussed DDx of chest pain.  Unclear why has pleuritic component.  Normal exam. Will treat for reflux, and f/u if not improving.   I spent 33 minutes dedicated to the care of this patient, including pre-visit review of records, face to face time, post-visit ordering of testing and documentation.   Be sure to read the information and make some changes to your diet. Use omeprazole 40 mg once daily (30 minutes prior to eating breakfast). Use this for up to a month (use for a minimum of 2 weeks). If you have recurrent symptoms after stopping, let us know. If you don't get any improvement, let us know. If you develop trouble swallowing, or other new symptoms, please follow-up.

## 2022-10-29 NOTE — Patient Instructions (Signed)
  Be sure to read the information and make some changes to your diet. Use omeprazole 40 mg once daily (30 minutes prior to eating breakfast). Use this for up to a month (use for a minimum of 2 weeks). If you have recurrent symptoms after stopping, let us know. If you don't get any improvement, let us know. If you develop trouble swallowing, or other new symptoms, please follow-up.

## 2022-11-06 ENCOUNTER — Other Ambulatory Visit: Payer: BC Managed Care – PPO

## 2022-11-14 ENCOUNTER — Other Ambulatory Visit (INDEPENDENT_AMBULATORY_CARE_PROVIDER_SITE_OTHER): Payer: BC Managed Care – PPO

## 2022-11-14 DIAGNOSIS — Z23 Encounter for immunization: Secondary | ICD-10-CM

## 2023-02-05 ENCOUNTER — Ambulatory Visit: Payer: BC Managed Care – PPO | Admitting: Medical

## 2023-02-05 VITALS — BP 120/74 | HR 74 | Wt 189.0 lb

## 2023-02-05 DIAGNOSIS — G8929 Other chronic pain: Secondary | ICD-10-CM

## 2023-02-05 DIAGNOSIS — M25561 Pain in right knee: Secondary | ICD-10-CM

## 2023-02-05 NOTE — Patient Instructions (Signed)
Right knee pain chronic  I have placed a referral today to sports medicine.  Expect a phone call about scheduling Begin over-the-counter glucosamine chondroitin and supplement daily.  1 brand example would be Osteo Bi-Flex. You can either continue the fish oil supplement or stop it if it does not seem to be helping You can use your topical cream that you had with you today From time to time when you have a worse flareup of pain you can use Aleve over-the-counter once or twice a day for a few days.  Try not to use anti-inflammatories such as Aleve every day nonstop Your acetaminophen Tylenol can also be used as needed If you have a day where your pain is particularly worse you can do rest including resting the leg on a pillow and using a cold pack such as ice water for 20 minutes at a time I do not know that your knee brace is actually helping at this point so you can probably either use a knee sleeve or just wait and see what the orthopedic sports medicine person assist

## 2023-02-05 NOTE — Progress Notes (Signed)
Subjective:  Derek Cowan is a 52 y.o. male who presents for Chief Complaint  Patient presents with   Knee Pain    Right knee pain x 1 year. Went to [facility] and get great with but since June/July and has been flaring up since     Here for right knee pain x 1 year.  Pain is most days now, was more intermittent prior.  Denies swelling.   No numbness or tingling in leg currently.  Hurts mostly right knee, medial side.   Going down stairs can aggravate pain, up stairs not great.  Sometimes driving long distances he will get pain to the point he has to pull over and take a break.  Saw [facility] back in spring 2024, did several sessions for leg pain, tightness.   Still having knee pain noted.  He has been using a knee brace.  He is taking Tylenol fairly regularly.  Using hemp base topical cream for arthritis as well.  Nothing seems to really help all that much.  No other aggravating or relieving factors.    No other c/o.  The following portions of the patient's history were reviewed and updated as appropriate: allergies, current medications, past family history, past medical history, past social history, past surgical history and problem list.  ROS Otherwise as in subjective above   Objective: BP 120/74   Pulse 74   Wt 189 lb (85.7 kg)   BMI 27.91 kg/m   General appearance: alert, no distress, well developed, well nourished Mild tenderness of the right knee medial patella and medial portion of the distal femur, otherwise knee nontender, no swelling, no obvious laxity, normal range of motion Legs with normal strength and sensation, normal pulses   Right knee xray 06/30/22: IMPRESSION: Enthesopathic changes off the superior patella. No other abnormalities.    Assessment: Encounter Diagnosis  Name Primary?   Chronic pain of right knee Yes     Plan: We discussed his ongoing pain.  Referral to sports medicine.  Advised good supportive shoe wear.  Discussed  recommendations as below.  Patient Instructions  Right knee pain chronic  I have placed a referral today to sports medicine.  Expect a phone call about scheduling Begin over-the-counter glucosamine chondroitin and supplement daily.  1 brand example would be Osteo Bi-Flex. You can either continue the fish oil supplement or stop it if it does not seem to be helping You can use your topical cream that you had with you today From time to time when you have a worse flareup of pain you can use Aleve over-the-counter once or twice a day for a few days.  Try not to use anti-inflammatories such as Aleve every day nonstop Your acetaminophen Tylenol can also be used as needed If you have a day where your pain is particularly worse you can do rest including resting the leg on a pillow and using a cold pack such as ice water for 20 minutes at a time I do not know that your knee brace is actually helping at this point so you can probably either use a knee sleeve or just wait and see what the orthopedic sports medicine person assist    Derek Cowan was seen today for knee pain.  Diagnoses and all orders for this visit:  Chronic pain of right knee -     Ambulatory referral to Sports Medicine    Follow up: prn

## 2023-02-10 ENCOUNTER — Ambulatory Visit: Payer: BC Managed Care – PPO | Admitting: Family Medicine

## 2023-02-10 ENCOUNTER — Other Ambulatory Visit: Payer: Self-pay

## 2023-02-10 VITALS — BP 138/86 | Ht 69.0 in | Wt 189.0 lb

## 2023-02-10 DIAGNOSIS — G8929 Other chronic pain: Secondary | ICD-10-CM | POA: Diagnosis not present

## 2023-02-10 DIAGNOSIS — M25561 Pain in right knee: Secondary | ICD-10-CM

## 2023-02-10 MED ORDER — MELOXICAM 15 MG PO TABS: 15.0000 mg | ORAL_TABLET | Freq: Every day | ORAL | 1 refills | Status: DC

## 2023-02-10 NOTE — Progress Notes (Unsigned)
PCP: Jac Canavan, PA-C  Chief Complaint: R knee pain Subjective:   HPI: Patient is a 52 y.o. male here for right knee pain. Patient has been dealing with this pain for the past few months, stated it started in February over this year. Patient saw his PCP at that time who recommended patient get PT and xrays, patient had some improvement after PT but states that recently his pain has returned. 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 Patient states going up and down stairs is difficult. Patient has no other concern at this time and denies any trauma to the area.    Past Medical History:  Diagnosis Date   Eczema    Impaired fasting blood sugar    Vitamin D deficiency     Current Outpatient Medications on File Prior to Visit  Medication Sig Dispense Refill   aspirin-sod bicarb-citric acid (ALKA-SELTZER) 325 MG TBEF tablet Take 325 mg by mouth every 6 (six) hours as needed.     betamethasone valerate ointment (VALISONE) 0.1 % Apply 1 Application topically 2 (two) times daily. 45 g 0   cholecalciferol (VITAMIN D3) 25 MCG (1000 UNIT) tablet Take 1 tablet (1,000 Units total) by mouth daily. 90 tablet 3   Crisaborole (EUCRISA) 2 % OINT APPLY TOPICALLY ONCE DAILY     famotidine (PEPCID) 20 MG tablet Take 20 mg by mouth 2 (two) times daily.     fluocinonide cream (LIDEX) 0.05 % Apply topically.     omeprazole (PRILOSEC) 40 MG capsule Take 1 capsule (40 mg total) by mouth daily. 30 capsule 0   No current facility-administered medications on file prior to visit.    Past Surgical History:  Procedure Laterality Date   HAND SURGERY  2021   tumor of left hand, benign    Allergies  Allergen Reactions   Lactose Intolerance (Gi)     BP 138/86   Ht 5\' 9"  (1.753 m)   Wt 189 lb (85.7 kg)   BMI 27.91 kg/m       No data to display              No data to display               Objective:  Physical Exam:  Gen: NAD, comfortable in exam room  Knee, Right:  Inspection was negative for erythema, ecchymosis, and effusion. No obvious bony abnormalities or signs of osteophyte development. Palpation yielded no asymmetric warmth; Medial joint line tenderness; No condyle tenderness; Mild patellar tenderness; No knee crepitus. Patellar and quadriceps tendons unremarkable, and no tenderness of the pes anserine bursa. No obvious Baker's cyst development. ROM normal in flexion (135 degrees) and extension (0 degrees). Strength 5/5 with knee flexion and extension. Neurovascularly intact bilaterally.   Provocative Testing:    - Patella:   - Patellar grind/compression: NEG   - Patellar glide: Appropriate medial/lateral glide without apprehension - Cruciate Ligaments:   - Anterior Drawer/Lachman test: NEG - Posterior Drawer: NEG  - Collateral Ligaments:   - Varus/Valgus (MCL/LCL) Stress test at 0, 15d: NEG   - Apley's Compression/Distraction test: NEG  - Meniscus:   - Thessaly: NEG   - McMurray's: NEG  U/S Right knee:  No effusion in the suprapatellar pouch Patellar tendon is normal LAX view of Quadriceps tendon has some hypoechoic changes near its insertion on that patella. There  appears to be some hyperechoic changes at the quadriceps insertion which could be consistent with calcification. SAX view of the patella has some hyperechoic changes at its superior aspect consistent with calcification. Lateral meniscus is  intact  Medial meniscus is intact, joint line is visualized without spurring  Posterior meniscus laterally is visualized and appears intact. Vessels visualized and patent.     Assessment & Plan:  1. 1. Chronic pain of right knee - Patients xrays show a superior patella enthesophyte which could be consistent with calcinosis of the quad tendon. Patients ultrasound also show similar findings. At this time, will have patient do formal PT and ESWT for the superior  pole of the patella. Will also do meloxicam for 14 days. Will trial 4 sessions of ESWT and reevaluate before considering more therapies. Will also consider doing MRI if no improvement despite these interventions.   - [place] LIMITED JOINT SPACE STRUCTURES LOW RIGHT; Future - Ambulatory referral to Physical Therapy    Brenton Grills MD, PGY-4  Sports Medicine Fellow [facility]  Addendum:  Patient seen and examined in the office by fellow.   History, exam, ultrasound images, plan of care were precepted with me.  Agree with findings as documented in fellow note .  Darene Lamer, DO, CAQSM

## 2023-02-11 ENCOUNTER — Encounter: Payer: Self-pay | Admitting: Family Medicine

## 2023-02-17 ENCOUNTER — Ambulatory Visit (INDEPENDENT_AMBULATORY_CARE_PROVIDER_SITE_OTHER): Payer: Self-pay | Admitting: Family Medicine

## 2023-02-17 ENCOUNTER — Encounter: Payer: Self-pay | Admitting: Family Medicine

## 2023-02-17 ENCOUNTER — Ambulatory Visit
Admission: RE | Admit: 2023-02-17 | Discharge: 2023-02-17 | Disposition: A | Discharge status: Home / Self Care | Payer: BC Managed Care – PPO | Source: Ambulatory Visit | Attending: Family Medicine

## 2023-02-17 VITALS — Ht 69.0 in

## 2023-02-17 DIAGNOSIS — M25562 Pain in left knee: Secondary | ICD-10-CM

## 2023-02-17 DIAGNOSIS — G8929 Other chronic pain: Secondary | ICD-10-CM

## 2023-02-17 DIAGNOSIS — M25561 Pain in right knee: Secondary | ICD-10-CM

## 2023-02-17 NOTE — Patient Instructions (Signed)
Today you had ESWT (extracorporeal shock wave therapy).  This is a procedure that sends sound waves into your body to break up calcifications, scar tissue, and helps to stimulate healing processes.   ESWT is considered very safe with the major side effects being mild pain and sometimes bruising.  Many patients experience some benefit in the first 24 hours with better movement.  However, the benefits occur gradually over 5 to 7 days.  For most conditions ESWT requires at least 4 to 6 sessions and sometimes prolonged treatment administered once weekly.   Most insurances do not cover ESWT as it is a relatively new treatment for many conditions.  We try to keep the charge similar to a co-pay so as to lessen your out-of-pocket costs.   If you have more severe or progressive pain, fever, swelling or skin breakdown after a treatment you should call our office.

## 2023-02-17 NOTE — Progress Notes (Signed)
PCP: Jac Canavan, PA-C  Chief Complaint: left knee pain, right knee shockwave Subjective:   HPI: Patient is a 52 y.o. male here for Right knee shockwave.  Patient is also complaining of some left-sided knee pain.  Patient states the left knee was the original 1 that hurt approximately a year ago but that after PT it started to get better.  Patient notes that pain has been coming back recently on the left side and that the pain is similar to the right side.  Patient states that the pain on the left side is more so medially near his patella.  Patient notes that is not nearly as bad as the right side currently but is wondering if there is something else he should be doing for the left side.  Patient states that his right knee has been about the same since previous visit, patient has not started physical therapy yet.   Past Medical History:  Diagnosis Date   Eczema    Impaired fasting blood sugar    Vitamin D deficiency     Current Outpatient Medications on File Prior to Visit  Medication Sig Dispense Refill   aspirin-sod bicarb-citric acid (ALKA-SELTZER) 325 MG TBEF tablet Take 325 mg by mouth every 6 (six) hours as needed.     betamethasone valerate ointment (VALISONE) 0.1 % Apply 1 Application topically 2 (two) times daily. 45 g 0   cholecalciferol (VITAMIN D3) 25 MCG (1000 UNIT) tablet Take 1 tablet (1,000 Units total) by mouth daily. 90 tablet 3   Crisaborole (EUCRISA) 2 % OINT APPLY TOPICALLY ONCE DAILY     famotidine (PEPCID) 20 MG tablet Take 20 mg by mouth 2 (two) times daily.     fluocinonide cream (LIDEX) 0.05 % Apply topically.     meloxicam (MOBIC) 15 MG tablet Take 1 tablet (15 mg total) by mouth daily. 30 tablet 1   omeprazole (PRILOSEC) 40 MG capsule Take 1 capsule (40 mg total) by mouth daily. 30 capsule 0   No current facility-administered medications on file prior to visit.    Past Surgical History:  Procedure Laterality Date   HAND SURGERY  2021   tumor of left  hand, benign    Allergies  Allergen Reactions   Lactose Intolerance (Gi)     Ht 5\' 9"  (1.753 m)   BMI 27.91 kg/m       No data to display              No data to display              Objective:  Physical Exam:  Gen: NAD, comfortable in exam room  Left knee: Shows no signs of swelling, erythema or bony abnormality.  No warmth noted over the left knee.  There is some mild tenderness to palpation over the medial joint line as well as the medial side of the patella.  No tenderness to palpation of the lateral joint lines, quadriceps or patellar tendon.  The patella itself has no crepitus.  There is appropriate flexion and extension of the knee.  Strength is 5 out of 5 with flexion extension without any pain. Varus-negative Valgus-negative McMurray-negative   Assessment & Plan:  1. 1. Left knee pain, unspecified chronicity - Given patient's left knee pain without any specific physical exam, we will go ahead and get an x-ray of the left knee at this time.  When patient starts physical therapy he will be doing quadricep strengthening of both sides. - DG Knee  AP/LAT W/Sunrise Left; Future  2. Chronic pain of right knee - Patient tolerated procedure well without any difficulties, this is the first session.  Will go ahead and do second session next week, patient is setting up physical therapy at this time as well so we will do shockwave in conjunction with physical therapy   Procedure: ECSWT Indications:  Right Knee quad tendonitis/calcification   Procedure Details Consent: Risks of procedure as well as the alternatives and risks of each were explained to the patient.  Verbal consent for procedure obtained. Time Out: Verified patient identification, verified procedure, site was marked, verified correct patient position. The area was cleaned with alcohol swab.     The right quad tendon, medial and lateral patella was targeted for Extracorporeal shockwave therapy.    Preset:  Patellar tendinopathy Power Level: 90 Frequency: 12 Impulse/cycles: 2500 Head size: Regular   Patient tolerated procedure well without immediate complications.      Brenton Grills MD, PGY-4  Sports Medicine Fellow [facility]  Addendum:  Patient seen in the office by fellow.   History, exam, plan of care were precepted with me.  Agree with findings as documented in fellow note above.  Darene Lamer, DO, CAQSM

## 2023-03-20 ENCOUNTER — Encounter (HOSPITAL_BASED_OUTPATIENT_CLINIC_OR_DEPARTMENT_OTHER): Payer: Self-pay | Admitting: Physical Therapy

## 2023-03-20 ENCOUNTER — Ambulatory Visit (HOSPITAL_BASED_OUTPATIENT_CLINIC_OR_DEPARTMENT_OTHER): Payer: BC Managed Care – PPO | Attending: Family Medicine | Admitting: Physical Therapy

## 2023-03-20 ENCOUNTER — Other Ambulatory Visit: Payer: Self-pay

## 2023-03-20 DIAGNOSIS — R2689 Other abnormalities of gait and mobility: Secondary | ICD-10-CM | POA: Insufficient documentation

## 2023-03-20 DIAGNOSIS — M6281 Muscle weakness (generalized): Secondary | ICD-10-CM | POA: Diagnosis present

## 2023-03-20 DIAGNOSIS — G8929 Other chronic pain: Secondary | ICD-10-CM | POA: Diagnosis not present

## 2023-03-20 DIAGNOSIS — R29898 Other symptoms and signs involving the musculoskeletal system: Secondary | ICD-10-CM | POA: Diagnosis present

## 2023-03-20 DIAGNOSIS — M25561 Pain in right knee: Secondary | ICD-10-CM | POA: Diagnosis present

## 2023-03-20 DIAGNOSIS — M25562 Pain in left knee: Secondary | ICD-10-CM | POA: Insufficient documentation

## 2023-03-20 NOTE — Therapy (Signed)
OUTPATIENT PHYSICAL THERAPY LOWER EXTREMITY EVALUATION   Patient Name: Derek Cowan MRN: [id number] DOB:1970-09-15, 52 y.o., male Today's Date: 03/20/2023  END OF SESSION:  PT End of Session - 03/20/23 0936     Visit Number 1    Number of Visits 16   1-2x/week for 8 weeks   Date for PT Re-Evaluation 05/15/23    Authorization Type [employer]    PT Start Time [phone]   arrives late/delayed check in   PT Stop Time 1014    PT Time Calculation (min) 37 min    Activity Tolerance Patient tolerated treatment well    Behavior During Therapy [facility] for tasks assessed/performed             Past Medical History:  Diagnosis Date   Eczema    Impaired fasting blood sugar    Vitamin D deficiency    Past Surgical History:  Procedure Laterality Date   HAND SURGERY  2021   tumor of left hand, benign   Patient Active Problem List   Diagnosis Date Noted   Vitamin D deficiency 06/30/2022   Paresthesia 11/20/2020   Varicose veins of both lower extremities 11/20/2020   Skin tags, multiple acquired 11/20/2020   Hair loss 11/20/2020   Alkaline phosphatase elevation 03/02/2020   Impaired fasting blood sugar 03/02/2020   BMI 29.0-29.9,adult 03/02/2020   Encounter for health maintenance examination in adult 11/18/2019   Screening for lipid disorders 11/18/2019   Screening for prostate cancer 11/18/2019   Screen for colon cancer 11/18/2019   Screen for STD (sexually transmitted disease) 11/18/2019   Chronic nonintractable headache 09/28/2019   Snoring 09/28/2019   Ganglion cyst of finger of left hand 12/24/2018   Need for influenza vaccination 12/24/2018   Eczema 12/24/2018   Influenza vaccination declined 11/27/2017    PCP: Jac Canavan, PA-C  REFERRING PROVIDER: Brenton Grills, MD  REFERRING DIAG: [phone] (ICD-10-CM) - Chronic pain of right knee; Evaluate and treat for right quadriceps tendon calcific tendinitis.  THERAPY DIAG:  Right knee pain, unspecified chronicity  Muscle  weakness (generalized)  Other abnormalities of gait and mobility  Other symptoms and signs involving the musculoskeletal system  Left knee pain, unspecified chronicity  Rationale for Evaluation and Treatment: Rehabilitation  ONSET DATE: Feb 2024  SUBJECTIVE:   SUBJECTIVE STATEMENT: Patient states knee pain in both with R>L back in February. Did Some PT which helped but symptoms increased following PT. Symptoms worse with driving and pedal on piano. MD told him that he had some calcific buildup in R patellar tendon. They did some electric shockwave therapy on it. Increase pain when walking down stairs. Feels better since the shockwave therapy. Been using knee braces/sleeves.   PERTINENT HISTORY: Hx knee pain PAIN:  Are you having pain? Yes: NPRS scale: 2/10 Pain location: R knee Pain description: sharp Aggravating factors: stairs, piano pedal, driving Relieving factors: brace  PRECAUTIONS: None  WEIGHT BEARING RESTRICTIONS: No  FALLS:  Has patient fallen in last 6 months? No  OCCUPATION: Education  PLOF: Independent  PATIENT GOALS: getting some relief/eliminate pain   OBJECTIVE: (objective measures from initial evaluation unless otherwise dated)   PATIENT SURVEYS:  FOTO 59% function  COGNITION: Overall cognitive status: Within functional limits for tasks assessed     SENSATION: WFL  POSTURE: No Significant postural limitations  PALPATION: TTP R medial femoral condyle; Patellar mobility WFL bilateral  LOWER EXTREMITY ROM:  Active ROM Right eval Left eval  Hip flexion    Hip extension  Hip abduction    Hip adduction    Hip internal rotation    Hip external rotation    Knee flexion 135 137  Knee extension 0 0  Ankle dorsiflexion    Ankle plantarflexion    Ankle inversion    Ankle eversion     (Blank rows = not tested) *= pain/symptoms  LOWER EXTREMITY MMT:  MMT Right eval Left eval  Hip flexion 5 5  Hip extension 4+ 5  Hip abduction 4+  5  Hip adduction    Hip internal rotation    Hip external rotation    Knee flexion 5 5  Knee extension 5 5  Ankle dorsiflexion 5 5  Ankle plantarflexion    Ankle inversion    Ankle eversion     (Blank rows = not tested) *= pain/symptoms    FUNCTIONAL TESTS:  5 times sit to stand: 12.42 seconds without UE use; R knee pain, slight decreased motor control Forward step down test: R knee valgus and impaired motor control, symptoms, decreased eccentric quad/glute strength  GAIT: Distance walked:  100 feet Assistive device utilized: None Level of assistance: Complete Independence Comments: WFL   TODAY'S TREATMENT:                                                                                                                              DATE:  03/20/23 Long arc quad 10 x 5 second holds    PATIENT EDUCATION:  Education details: Patient educated on exam findings, POC, scope of PT, HEP, and anatomy. Person educated: Patient Education method: Explanation, Demonstration, and Handouts Education comprehension: verbalized understanding, returned demonstration, verbal cues required, and tactile cues required  HOME EXERCISE PROGRAM: Access Code: [email or web address] URL: [email or web address] Date: 03/20/2023 Prepared by: Greig Castilla Keneth Borg  Exercises - Seated Long Arc Quad  - 5 x daily - 7 x weekly - 1-2 sets - 10 reps  ASSESSMENT:  CLINICAL IMPRESSION: Patient a 52 y.o. y.o. male who was seen today for physical therapy evaluation and treatment for bilateral knee pain. Patient presents with pain limited deficits in bilateral knee strength, ROM, endurance, activity tolerance, and functional mobility with ADL. Patient is having to modify and restrict ADL as indicated by outcome measure score as well as subjective information and objective measures which is affecting overall participation. Patient will benefit from skilled physical therapy in order to improve function and reduce  impairment.  OBJECTIVE IMPAIRMENTS: decreased activity tolerance, decreased mobility, difficulty walking, decreased ROM, decreased strength, improper body mechanics, and pain.   ACTIVITY LIMITATIONS: carrying, lifting, bending, squatting, stairs, transfers, locomotion level, and caring for others  PARTICIPATION LIMITATIONS: meal prep, cleaning, laundry, shopping, community activity, occupation, and yard work  PERSONAL FACTORS: Fitness and Time since onset of injury/illness/exacerbation are also affecting patient's functional outcome.   REHAB POTENTIAL: Good  CLINICAL DECISION MAKING: Stable/uncomplicated  EVALUATION COMPLEXITY: Low   GOALS: Goals reviewed with patient? Yes  SHORT TERM GOALS: Target date: 04/17/2023  Patient will be independent with HEP in order to improve functional outcomes. Baseline: Goal status: INITIAL  2.  Patient will report at least 25% improvement in symptoms for improved quality of life. Baseline: Goal status: INITIAL   LONG TERM GOALS: Target date: 05/15/2023    Patient will report at least 75% improvement in symptoms for improved quality of life. Baseline:  Goal status: INITIAL  2.  Patient will improve FOTO score to predicted outcomes in order to indicate improved tolerance to activity. Baseline: 59% function Goal status: INITIAL  3.  Patient will be able to navigate stairs with reciprocal pattern without compensation in order to demonstrate improved LE strength. Baseline:  Goal status: INITIAL  4.  Patient will be able to perform forward step down test without deviation in order to demonstrate improved LE strength and motor control.  Baseline:  Goal status: INITIAL  5.  Patient will be able to complete 5x STS in under 11.4 seconds in order to reduce the risk of falls. Baseline:  Goal status: INITIAL  6.  Patient will demonstrate grade of 5/5 MMT grade in all tested musculature as evidence of improved strength to assist with stair  ambulation and gait.   Baseline:  Goal status: INITIAL   PLAN:  PT FREQUENCY: 1-2x/week  PT DURATION: 8 weeks  PLANNED INTERVENTIONS: 97164- PT Re-evaluation, 97110-Therapeutic exercises, 97530- Therapeutic activity, 97112- Neuromuscular re-education, 97535- Self Care, 11914- Manual therapy, [phone]- Gait training, [phone]- Orthotic Fit/training, [phone]- Canalith repositioning, [id number]- Aquatic Therapy, [phone]- Splinting, Patient/Family education, Balance training, Stair training, Taping, Dry Needling, Joint mobilization, Joint manipulation, Spinal manipulation, Spinal mobilization, Scar mobilization, and DME instructions.  PLAN FOR NEXT SESSION: quad and glute strength and motor control   Wyman Songster, PT 03/20/2023, 10:19 AM

## 2023-04-09 ENCOUNTER — Encounter (HOSPITAL_BASED_OUTPATIENT_CLINIC_OR_DEPARTMENT_OTHER): Payer: BC Managed Care – PPO | Admitting: Physical Therapy

## 2023-04-16 ENCOUNTER — Encounter (HOSPITAL_BASED_OUTPATIENT_CLINIC_OR_DEPARTMENT_OTHER): Payer: Self-pay | Admitting: Physical Therapy

## 2023-04-16 ENCOUNTER — Ambulatory Visit (HOSPITAL_BASED_OUTPATIENT_CLINIC_OR_DEPARTMENT_OTHER): Payer: Self-pay | Attending: Family Medicine | Admitting: Physical Therapy

## 2023-04-16 DIAGNOSIS — M25561 Pain in right knee: Secondary | ICD-10-CM

## 2023-04-16 DIAGNOSIS — R29898 Other symptoms and signs involving the musculoskeletal system: Secondary | ICD-10-CM | POA: Diagnosis present

## 2023-04-16 DIAGNOSIS — R2689 Other abnormalities of gait and mobility: Secondary | ICD-10-CM | POA: Diagnosis present

## 2023-04-16 DIAGNOSIS — M6281 Muscle weakness (generalized): Secondary | ICD-10-CM | POA: Diagnosis present

## 2023-04-16 NOTE — Therapy (Signed)
OUTPATIENT PHYSICAL THERAPY LOWER EXTREMITY TREATMENT   Patient Name: Derek Cowan MRN: [id number] DOB:Aug 15, 1970, 53 y.o., male Today's Date: 04/16/2023  END OF SESSION:  PT End of Session - 04/16/23 1538     Visit Number 2    Number of Visits 16    Date for PT Re-Evaluation 05/15/23    Authorization Type [employer]    PT Start Time 1538   pt arrived late   PT Stop Time 1616    PT Time Calculation (min) 38 min    Behavior During Therapy WFL for tasks assessed/performed             Past Medical History:  Diagnosis Date   Eczema    Impaired fasting blood sugar    Vitamin D deficiency    Past Surgical History:  Procedure Laterality Date   HAND SURGERY  2021   tumor of left hand, benign   Patient Active Problem List   Diagnosis Date Noted   Vitamin D deficiency 06/30/2022   Paresthesia 11/20/2020   Varicose veins of both lower extremities 11/20/2020   Skin tags, multiple acquired 11/20/2020   Hair loss 11/20/2020   Alkaline phosphatase elevation 03/02/2020   Impaired fasting blood sugar 03/02/2020   BMI 29.0-29.9,adult 03/02/2020   Encounter for health maintenance examination in adult 11/18/2019   Screening for lipid disorders 11/18/2019   Screening for prostate cancer 11/18/2019   Screen for colon cancer 11/18/2019   Screen for STD (sexually transmitted disease) 11/18/2019   Chronic nonintractable headache 09/28/2019   Snoring 09/28/2019   Ganglion cyst of finger of left hand 12/24/2018   Need for influenza vaccination 12/24/2018   Eczema 12/24/2018   Influenza vaccination declined 11/27/2017    PCP: Jac Canavan, PA-C  REFERRING PROVIDER: Brenton Grills, MD  REFERRING DIAG: [phone] (ICD-10-CM) - Chronic pain of right knee; Evaluate and treat for right quadriceps tendon calcific tendinitis.  THERAPY DIAG:  Right knee pain, unspecified chronicity  Muscle weakness (generalized)  Other abnormalities of gait and mobility  Other symptoms and signs  involving the musculoskeletal system  Rationale for Evaluation and Treatment: Rehabilitation  ONSET DATE: Feb 2024  SUBJECTIVE:   SUBJECTIVE STATEMENT: Pt reports he feels better since the shockwave therapy.  He has been doing stretches (full body) that he found on youtube.  He is completing LAQ 1x /day.  PERTINENT HISTORY: Hx knee pain PAIN:  Are you having pain? Yes: NPRS scale: 2/10 Pain location: R knee Pain description: sharp Aggravating factors: stairs, piano pedal, driving Relieving factors: brace  PRECAUTIONS: None  WEIGHT BEARING RESTRICTIONS: No  FALLS:  Has patient fallen in last 6 months? No  OCCUPATION: Education  PLOF: Independent  PATIENT GOALS: getting some relief/eliminate pain   OBJECTIVE: (objective measures from initial evaluation unless otherwise dated)   PATIENT SURVEYS:  FOTO 59% function  COGNITION: Overall cognitive status: Within functional limits for tasks assessed     SENSATION: WFL  POSTURE: No Significant postural limitations  PALPATION: TTP R medial femoral condyle; Patellar mobility WFL bilateral  LOWER EXTREMITY ROM:  Active ROM Right eval Left eval  Hip flexion    Hip extension    Hip abduction    Hip adduction    Hip internal rotation    Hip external rotation    Knee flexion 135 137  Knee extension 0 0  Ankle dorsiflexion    Ankle plantarflexion    Ankle inversion    Ankle eversion     (Blank rows = not tested) *=  pain/symptoms  LOWER EXTREMITY MMT:  MMT Right eval Left eval  Hip flexion 5 5  Hip extension 4+ 5  Hip abduction 4+ 5  Hip adduction    Hip internal rotation    Hip external rotation    Knee flexion 5 5  Knee extension 5 5  Ankle dorsiflexion 5 5  Ankle plantarflexion    Ankle inversion    Ankle eversion     (Blank rows = not tested) *= pain/symptoms    FUNCTIONAL TESTS:  5 times sit to stand: 12.42 seconds without UE use; R knee pain, slight decreased motor control Forward step  down test: R knee valgus and impaired motor control, symptoms, decreased eccentric quad/glute strength  GAIT: Distance walked:  100 feet Assistive device utilized: None Level of assistance: Complete Independence Comments: WFL   TODAY'S TREATMENT:                                                                                                                              DATE:  04/16/23 -Standing quad stretch x 20s each  -Rock tape application to R ant knee  - forward step up/step down, retro step up/down x 8 RLE, x 5 LLE (no increase in pain) - sidelying R hip abdct x 10, clams x 10, hip abdct with 3 pulses x 10 (req rest after 7) - SLR x 10 slow - Fig 4 bridge with 5 sec hold in ext   PATIENT EDUCATION:  Education details: , HEP anatomy, ktape rationale and safe removal technique  Person educated: Patient Education method: Explanation, Demonstration, and Handouts Education comprehension: verbalized understanding, returned demonstration, verbal cues required, and tactile cues required  HOME EXERCISE PROGRAM: Access Code: [email or web address] URL: [email or web address] Date: 03/20/2023 Prepared by: Greig Castilla Zaunegger  Exercises - Seated Long Arc Quad  - 5 x daily - 7 x weekly - 1-2 sets - 10 reps  ASSESSMENT:  CLINICAL IMPRESSION:  Pt reported minimal change in pain during session; remained 1-2/10.  Rt hip fatigues during exercises, compared to LLE.  Updated HEP.  Will progress as tolerated.  Goals are ongoing.    From initial evaluation: Patient a 53 y.o. y.o. male who was seen today for physical therapy evaluation and treatment for bilateral knee pain. Patient presents with pain limited deficits in bilateral knee strength, ROM, endurance, activity tolerance, and functional mobility with ADL. Patient is having to modify and restrict ADL as indicated by outcome measure score as well as subjective information and objective measures which is affecting overall participation. Patient  will benefit from skilled physical therapy in order to improve function and reduce impairment.  OBJECTIVE IMPAIRMENTS: decreased activity tolerance, decreased mobility, difficulty walking, decreased ROM, decreased strength, improper body mechanics, and pain.   ACTIVITY LIMITATIONS: carrying, lifting, bending, squatting, stairs, transfers, locomotion level, and caring for others  PARTICIPATION LIMITATIONS: meal prep, cleaning, laundry, shopping, community activity, occupation, and yard work  PERSONAL FACTORS: Fitness and Time since onset of  injury/illness/exacerbation are also affecting patient's functional outcome.   REHAB POTENTIAL: Good  CLINICAL DECISION MAKING: Stable/uncomplicated  EVALUATION COMPLEXITY: Low   GOALS: Goals reviewed with patient? Yes  SHORT TERM GOALS: Target date: 04/17/2023    Patient will be independent with HEP in order to improve functional outcomes. Baseline: Goal status: INITIAL  2.  Patient will report at least 25% improvement in symptoms for improved quality of life. Baseline: Goal status: INITIAL   LONG TERM GOALS: Target date: 05/15/2023    Patient will report at least 75% improvement in symptoms for improved quality of life. Baseline:  Goal status: INITIAL  2.  Patient will improve FOTO score to predicted outcomes in order to indicate improved tolerance to activity. Baseline: 59% function Goal status: INITIAL  3.  Patient will be able to navigate stairs with reciprocal pattern without compensation in order to demonstrate improved LE strength. Baseline:  Goal status: INITIAL  4.  Patient will be able to perform forward step down test without deviation in order to demonstrate improved LE strength and motor control.  Baseline:  Goal status: INITIAL  5.  Patient will be able to complete 5x STS in under 11.4 seconds in order to reduce the risk of falls. Baseline:  Goal status: INITIAL  6.  Patient will demonstrate grade of 5/5 MMT grade  in all tested musculature as evidence of improved strength to assist with stair ambulation and gait.   Baseline:  Goal status: INITIAL   PLAN:  PT FREQUENCY: 1-2x/week  PT DURATION: 8 weeks  PLANNED INTERVENTIONS: 97164- PT Re-evaluation, 97110-Therapeutic exercises, 97530- Therapeutic activity, 97112- Neuromuscular re-education, 97535- Self Care, 54270- Manual therapy, [phone]- Gait training, [phone]- Orthotic Fit/training, [phone]- Canalith repositioning, [id number]- Aquatic Therapy, [phone]- Splinting, Patient/Family education, Balance training, Stair training, Taping, Dry Needling, Joint mobilization, Joint manipulation, Spinal manipulation, Spinal mobilization, Scar mobilization, and DME instructions.  PLAN FOR NEXT SESSION: quad and glute strength and motor control  Mayer Camel, PTA 04/16/23 5:49 PM [facility] [address] [place], [place], [address] Phone: [phone]   Fax:  [phone]

## 2023-04-23 ENCOUNTER — Ambulatory Visit (HOSPITAL_BASED_OUTPATIENT_CLINIC_OR_DEPARTMENT_OTHER): Payer: 59 | Admitting: Physical Therapy

## 2023-04-23 ENCOUNTER — Encounter (HOSPITAL_BASED_OUTPATIENT_CLINIC_OR_DEPARTMENT_OTHER): Payer: Self-pay | Admitting: Physical Therapy

## 2023-04-23 DIAGNOSIS — M25561 Pain in right knee: Secondary | ICD-10-CM

## 2023-04-23 DIAGNOSIS — R2689 Other abnormalities of gait and mobility: Secondary | ICD-10-CM

## 2023-04-23 DIAGNOSIS — M6281 Muscle weakness (generalized): Secondary | ICD-10-CM

## 2023-04-23 NOTE — Therapy (Signed)
OUTPATIENT PHYSICAL THERAPY LOWER EXTREMITY TREATMENT   Patient Name: Derek Cowan MRN: [id number] DOB:02/21/1971, 53 y.o., male Today's Date: 04/23/2023  END OF SESSION:  PT End of Session - 04/23/23 1535     Visit Number 3    Number of Visits 16    Date for PT Re-Evaluation 05/15/23    Authorization Type BCBS    PT Start Time 1533    PT Stop Time 1611    PT Time Calculation (min) 38 min    Behavior During Therapy [facility] for tasks assessed/performed             Past Medical History:  Diagnosis Date   Eczema    Impaired fasting blood sugar    Vitamin D deficiency    Past Surgical History:  Procedure Laterality Date   HAND SURGERY  2021   tumor of left hand, benign   Patient Active Problem List   Diagnosis Date Noted   Vitamin D deficiency 06/30/2022   Paresthesia 11/20/2020   Varicose veins of both lower extremities 11/20/2020   Skin tags, multiple acquired 11/20/2020   Hair loss 11/20/2020   Alkaline phosphatase elevation 03/02/2020   Impaired fasting blood sugar 03/02/2020   BMI 29.0-29.9,adult 03/02/2020   Encounter for health maintenance examination in adult 11/18/2019   Screening for lipid disorders 11/18/2019   Screening for prostate cancer 11/18/2019   Screen for colon cancer 11/18/2019   Screen for STD (sexually transmitted disease) 11/18/2019   Chronic nonintractable headache 09/28/2019   Snoring 09/28/2019   Ganglion cyst of finger of left hand 12/24/2018   Need for influenza vaccination 12/24/2018   Eczema 12/24/2018   Influenza vaccination declined 11/27/2017    PCP: Jac Canavan, PA-C  REFERRING PROVIDER: Brenton Grills, MD  REFERRING DIAG: [phone] (ICD-10-CM) - Chronic pain of right knee; Evaluate and treat for right quadriceps tendon calcific tendinitis.  THERAPY DIAG:  Right knee pain, unspecified chronicity  Muscle weakness (generalized)  Other abnormalities of gait and mobility  Rationale for Evaluation and Treatment:  Rehabilitation  ONSET DATE: Feb 2024  SUBJECTIVE:   SUBJECTIVE STATEMENT: Pt reports has had intermittent pain in both knees- while sleeping, going down stairs, sitting too long during meeting, driving. Tape on knee came off next day; didn't notice a difference.   PERTINENT HISTORY: Hx knee pain PAIN:  Are you having pain? Yes: NPRS scale: 1-2/10 Pain location: R>L knee Pain description: sharp Aggravating factors: stairs, piano pedal, driving Relieving factors: brace  PRECAUTIONS: None  WEIGHT BEARING RESTRICTIONS: No  FALLS:  Has patient fallen in last 6 months? No  OCCUPATION: Education  PLOF: Independent  PATIENT GOALS: getting some relief/eliminate pain   OBJECTIVE: (objective measures from initial evaluation unless otherwise dated)   PATIENT SURVEYS:  FOTO 59% function  COGNITION: Overall cognitive status: Within functional limits for tasks assessed     SENSATION: WFL  POSTURE: No Significant postural limitations  PALPATION: TTP R medial femoral condyle; Patellar mobility WFL bilateral  LOWER EXTREMITY ROM:  Active ROM Right eval Left eval  Hip flexion    Hip extension    Hip abduction    Hip adduction    Hip internal rotation    Hip external rotation    Knee flexion 135 137  Knee extension 0 0  Ankle dorsiflexion    Ankle plantarflexion    Ankle inversion    Ankle eversion     (Blank rows = not tested) *= pain/symptoms  LOWER EXTREMITY MMT:  MMT Right eval  Left eval  Hip flexion 5 5  Hip extension 4+ 5  Hip abduction 4+ 5  Hip adduction    Hip internal rotation    Hip external rotation    Knee flexion 5 5  Knee extension 5 5  Ankle dorsiflexion 5 5  Ankle plantarflexion    Ankle inversion    Ankle eversion     (Blank rows = not tested) *= pain/symptoms    FUNCTIONAL TESTS:  5 times sit to stand: 12.42 seconds without UE use; R knee pain, slight decreased motor control Forward step down test: R knee valgus and impaired  motor control, symptoms, decreased eccentric quad/glute strength  GAIT: Distance walked:  100 feet Assistive device utilized: None Level of assistance: Complete Independence Comments: WFL   TODAY'S TREATMENT:                                                                                                                              DATE:  04/23/23 -treadmill 2. x 5.5 min for warm up - standing quad stretch x 20s x 2 each LE - sidelying hip abdct, 2 x 10 RLE, 1 x 10 - fig 4 bridge, 5 sec hold x 10 each LE - long sitting SLR-> abdct/addct 3 x 5 ,good challenge for RLE - standing mini squat with opp leg slide into abdct/ ext x 5 each side  - quadruped (knees on pillow on mat table) with LE ext x 3 each side (mild increase in pain with pressure on knees)  04/16/23 -Standing quad stretch x 20s each  -Rock tape application to R ant knee  - forward step up/step down, retro step up/down x 8 RLE, x 5 LLE (no increase in pain) - sidelying R hip abdct x 10, clams x 10, hip abdct with 3 pulses x 10 (req rest after 7) - SLR x 10 slow - Fig 4 bridge with 5 sec hold in ext    PATIENT EDUCATION:  Education details: exercise rationale and technique  Person educated: Patient Education method: Programmer, multimedia, Demonstration,  Education comprehension: verbalized understanding, returned demonstration, verbal cues required, and tactile cues required  HOME EXERCISE PROGRAM: Access Code: [email or web address] URL: [email or web address]  ASSESSMENT:  CLINICAL IMPRESSION: Continued hip weakness in Rt hip; fatigues quickly with exercise, but able to complete sets without pain. Pain in R knee increases with partial squats and with any kneeling with pressure on knees (with padding).  Will progress as tolerated.  Goals are ongoing.    From initial evaluation: Patient a 53 y.o. y.o. male who was seen today for physical therapy evaluation and treatment for bilateral knee pain. Patient presents with pain  limited deficits in bilateral knee strength, ROM, endurance, activity tolerance, and functional mobility with ADL. Patient is having to modify and restrict ADL as indicated by outcome measure score as well as subjective information and objective measures which is affecting overall participation. Patient will benefit from skilled physical therapy in order to  improve function and reduce impairment.  OBJECTIVE IMPAIRMENTS: decreased activity tolerance, decreased mobility, difficulty walking, decreased ROM, decreased strength, improper body mechanics, and pain.   ACTIVITY LIMITATIONS: carrying, lifting, bending, squatting, stairs, transfers, locomotion level, and caring for others  PARTICIPATION LIMITATIONS: meal prep, cleaning, laundry, shopping, community activity, occupation, and yard work  PERSONAL FACTORS: Fitness and Time since onset of injury/illness/exacerbation are also affecting patient's functional outcome.   REHAB POTENTIAL: Good  CLINICAL DECISION MAKING: Stable/uncomplicated  EVALUATION COMPLEXITY: Low   GOALS: Goals reviewed with patient? Yes  SHORT TERM GOALS: Target date: 04/17/2023    Patient will be independent with HEP in order to improve functional outcomes. Baseline: Goal status:IN PROGRESS - 04/23/23  2.  Patient will report at least 25% improvement in symptoms for improved quality of life. Baseline: Goal status:IN PROGRESS - 04/23/23   LONG TERM GOALS: Target date: 05/15/2023    Patient will report at least 75% improvement in symptoms for improved quality of life. Baseline:  Goal status: INITIAL  2.  Patient will improve FOTO score to predicted outcomes in order to indicate improved tolerance to activity. Baseline: 59% function Goal status: INITIAL  3.  Patient will be able to navigate stairs with reciprocal pattern without compensation in order to demonstrate improved LE strength. Baseline:  Goal status: INITIAL  4.  Patient will be able to perform  forward step down test without deviation in order to demonstrate improved LE strength and motor control.  Baseline:  Goal status: INITIAL  5.  Patient will be able to complete 5x STS in under 11.4 seconds in order to reduce the risk of falls. Baseline:  Goal status: INITIAL  6.  Patient will demonstrate grade of 5/5 MMT grade in all tested musculature as evidence of improved strength to assist with stair ambulation and gait.   Baseline:  Goal status: INITIAL   PLAN:  PT FREQUENCY: 1-2x/week  PT DURATION: 8 weeks  PLANNED INTERVENTIONS: 97164- PT Re-evaluation, 97110-Therapeutic exercises, 97530- Therapeutic activity, 97112- Neuromuscular re-education, 97535- Self Care, 40981- Manual therapy, [phone]- Gait training, [phone]- Orthotic Fit/training, [phone]- Canalith repositioning, [id number]- Aquatic Therapy, [phone]- Splinting, Patient/Family education, Balance training, Stair training, Taping, Dry Needling, Joint mobilization, Joint manipulation, Spinal manipulation, Spinal mobilization, Scar mobilization, and DME instructions.  PLAN FOR NEXT SESSION: quad and glute strength and motor control  Mayer Camel, PTA 04/23/23 6:06 PM [facility] [address] [place], [place], [address] Phone: [phone]   Fax:  [phone]

## 2023-04-28 ENCOUNTER — Encounter: Payer: Self-pay | Admitting: Medical

## 2023-04-28 ENCOUNTER — Ambulatory Visit: Payer: 59 | Admitting: Medical

## 2023-04-28 VITALS — BP 132/92 | HR 86 | Ht 69.0 in | Wt 185.0 lb

## 2023-04-28 DIAGNOSIS — I10 Essential (primary) hypertension: Secondary | ICD-10-CM

## 2023-04-28 DIAGNOSIS — R39198 Other difficulties with micturition: Secondary | ICD-10-CM

## 2023-04-28 DIAGNOSIS — F430 Acute stress reaction: Secondary | ICD-10-CM

## 2023-04-28 DIAGNOSIS — R21 Rash and other nonspecific skin eruption: Secondary | ICD-10-CM

## 2023-04-28 DIAGNOSIS — R0789 Other chest pain: Secondary | ICD-10-CM | POA: Diagnosis not present

## 2023-04-28 MED ORDER — CLOBETASOL PROPIONATE 0.05 % EX OINT: 1.0000 | TOPICAL_OINTMENT | Freq: Two times a day (BID) | CUTANEOUS | 0 refills | Status: DC

## 2023-04-28 NOTE — Progress Notes (Signed)
 Subjective:  Derek Cowan is a 53 y.o. male who presents for Chief Complaint  Patient presents with   Hypertension    Started having some high bp readings last week. 154/98. Kept a log on his phone. 154/100. Just hasn't been feeling well. 177/123. Went to UC on Friday, bp was 148/91 there. Rx'd bp medication bp went down. Last night 157/89, did not take medication today. 126/88 this am.     Here for concerns.  In the recent month or so he has had several things going on.  Back when it snowed in January he describes which was very stressful.  His heating system went out at his house as well adding to the stress.  His aunt passed away recently.  [place] he has had a difficult several weeks.  During the time he was doing some distress he was having some chest pains, heaviness in the chest, and ended up going to urgent care.  He had had several home blood pressure readings that were high as well.  Urgent care started him on losartan blood pressure pill  They had asked him to follow-up with us  primary care  His symptoms have been a little bit better but he still was concerned about his elevated blood pressures.  He is still getting some elevated readings  He has ongoing rash on his lower legs particular on the right.  He has tried different creams and nothing seems to make it improved.  He sometimes has weaker urine stream, little harder to pass urine sometimes.  No syncope, no palpitations, no edema  No other aggravating or relieving factors.    No other c/o.  Past Medical History:  Diagnosis Date   Eczema    Impaired fasting blood sugar    Vitamin D  deficiency    Current Outpatient Medications on File Prior to Visit  Medication Sig Dispense Refill   cholecalciferol (VITAMIN D3) 25 MCG (1000 UNIT) tablet Take 1 tablet (1,000 Units total) by mouth daily. 90 tablet 3   fluocinonide cream (LIDEX) 0.05 % Apply topically.     losartan (COZAAR) 25 MG tablet Take 25 mg by mouth daily.      omeprazole  (PRILOSEC) 40 MG capsule Take 1 capsule (40 mg total) by mouth daily. 30 capsule 0   betamethasone  valerate ointment (VALISONE ) 0.1 % Apply 1 Application topically 2 (two) times daily. (Patient not taking: Reported on 04/28/2023) 45 g 0   Crisaborole  (EUCRISA ) 2 % OINT APPLY TOPICALLY ONCE DAILY (Patient not taking: Reported on 04/28/2023)     No current facility-administered medications on file prior to visit.   Family History  Problem Relation Age of Onset   Lupus Mother    Lung disease Mother        ARDS   Hypertension Mother    Diabetes Father    Prostatitis Father    Arrhythmia Sister    Miscarriages / Stillbirths Sister    Hypertension Sister    Heart disease Paternal Aunt    Cancer Paternal Grandfather        lung   Stroke Neg Hx      The following portions of the patient's history were reviewed and updated as appropriate: allergies, current medications, past family history, past medical history, past social history, past surgical history and problem list.  ROS Otherwise as in subjective above    Objective: BP (!) 132/92   Pulse 86   Ht 5' 9 (1.753 m)   Wt 185 lb (83.9 kg)  BMI 27.32 kg/m   Wt Readings from Last 3 Encounters:  04/28/23 185 lb (83.9 kg)  02/10/23 189 lb (85.7 kg)  02/05/23 189 lb (85.7 kg)   BP Readings from Last 3 Encounters:  04/28/23 (!) 132/92  02/10/23 138/86  02/05/23 120/74   General appearance: alert, no distress, well developed, well nourished Neck: supple, no lymphadenopathy, no thyromegaly, no masses Heart: RRR, normal S1, S2, no murmurs Lungs: CTA bilaterally, no wheezes, rhonchi, or rales Arms and chest nontender no obvious deformity Pulses: 2+ radial pulses, 2+ pedal pulses, normal cap refill Ext: no edema There is some mild varicose veins of right lower leg There is some patches of flat brown coloration throughout the right lower leg and a few on the left lower leg    Assessment: Encounter Diagnoses  Name  Primary?   Primary hypertension Yes   Rash    Chest discomfort    Acute stress reaction    Decreased urine stream      Plan: HTN - new diagnosis.  Continue Losartan 25mg  daily.   Discussed diagnosis, work on stress reduction, continue exercise, work on [employer].   F/u 12mo  Chest discomfort - reviewed EKG today, likely stress related /anxiety with recent stressors, symptoms have improved  Acute stress reaction - fortunately most of his recent stressors have improved or has some resolution with those.  Decreased urine stream, likely BPH related - if still symptoms at next visit consider Hytrin  or Cardura for both hypertension and BPH.   Rash - has seen dermatology, nothing has seemed to help.  Lets try ointment below short term for the next 2 weeks max.  Continue compression hose and regular exercise.    Suvan was seen today for hypertension.  Diagnoses and all orders for this visit:  Primary hypertension -     EKG 12-Lead  Rash  Chest discomfort  Acute stress reaction  Decreased urine stream  Other orders -     clobetasol  ointment (TEMOVATE ) 0.05 %; Apply 1 Application topically 2 (two) times daily.    Follow up: 12mo

## 2023-04-30 ENCOUNTER — Encounter (HOSPITAL_BASED_OUTPATIENT_CLINIC_OR_DEPARTMENT_OTHER): Payer: Self-pay | Admitting: Physical Therapy

## 2023-04-30 ENCOUNTER — Ambulatory Visit (HOSPITAL_BASED_OUTPATIENT_CLINIC_OR_DEPARTMENT_OTHER): Payer: 59 | Attending: Family Medicine | Admitting: Physical Therapy

## 2023-04-30 DIAGNOSIS — M25562 Pain in left knee: Secondary | ICD-10-CM | POA: Insufficient documentation

## 2023-04-30 DIAGNOSIS — M25561 Pain in right knee: Secondary | ICD-10-CM | POA: Insufficient documentation

## 2023-04-30 DIAGNOSIS — M6281 Muscle weakness (generalized): Secondary | ICD-10-CM | POA: Diagnosis present

## 2023-04-30 DIAGNOSIS — R2689 Other abnormalities of gait and mobility: Secondary | ICD-10-CM | POA: Insufficient documentation

## 2023-04-30 DIAGNOSIS — R29898 Other symptoms and signs involving the musculoskeletal system: Secondary | ICD-10-CM | POA: Diagnosis present

## 2023-04-30 NOTE — Therapy (Signed)
 OUTPATIENT PHYSICAL THERAPY LOWER EXTREMITY TREATMENT   Patient Name: Derek Cowan MRN: [id number] DOB:1970/11/05, 53 y.o., male Today's Date: 04/30/2023  END OF SESSION:  PT End of Session - 04/30/23 1528     Visit Number 4    Number of Visits 16    Date for PT Re-Evaluation 05/15/23    Authorization Type BCBS    PT Start Time 1528   arrives late   PT Stop Time 1557    PT Time Calculation (min) 29 min    Activity Tolerance Patient tolerated treatment well    Behavior During Therapy [facility] for tasks assessed/performed             Past Medical History:  Diagnosis Date   Eczema    Impaired fasting blood sugar    Vitamin D  deficiency    Past Surgical History:  Procedure Laterality Date   HAND SURGERY  2021   tumor of left hand, benign   Patient Active Problem List   Diagnosis Date Noted   Vitamin D  deficiency 06/30/2022   Paresthesia 11/20/2020   Varicose veins of both lower extremities 11/20/2020   Skin tags, multiple acquired 11/20/2020   Hair loss 11/20/2020   Alkaline phosphatase elevation 03/02/2020   Impaired fasting blood sugar 03/02/2020   BMI 29.0-29.9,adult 03/02/2020   Encounter for health maintenance examination in adult 11/18/2019   Screening for lipid disorders 11/18/2019   Screening for prostate cancer 11/18/2019   Screen for colon cancer 11/18/2019   Screen for STD (sexually transmitted disease) 11/18/2019   Chronic nonintractable headache 09/28/2019   Snoring 09/28/2019   Ganglion cyst of finger of left hand 12/24/2018   Need for influenza vaccination 12/24/2018   Eczema 12/24/2018   Influenza vaccination declined 11/27/2017    PCP: Bulah Alm RAMAN, PA-C  REFERRING PROVIDER: Vita Morrow, MD  REFERRING DIAG: [phone] (ICD-10-CM) - Chronic pain of right knee; Evaluate and treat for right quadriceps tendon calcific tendinitis.  THERAPY DIAG:  Right knee pain, unspecified chronicity  Muscle weakness (generalized)  Other  abnormalities of gait and mobility  Other symptoms and signs involving the musculoskeletal system  Left knee pain, unspecified chronicity  Rationale for Evaluation and Treatment: Rehabilitation  ONSET DATE: Feb 2024  SUBJECTIVE:   SUBJECTIVE STATEMENT: Pt reports still having knee pain. Seeing some improvement.   PERTINENT HISTORY: Hx knee pain PAIN:  Are you having pain? Yes: NPRS scale: 1-2/10 Pain location: R>L knee Pain description: sharp Aggravating factors: stairs, piano pedal, driving Relieving factors: brace  PRECAUTIONS: None  WEIGHT BEARING RESTRICTIONS: No  FALLS:  Has patient fallen in last 6 months? No  OCCUPATION: Education  PLOF: Independent  PATIENT GOALS: getting some relief/eliminate pain   OBJECTIVE: (objective measures from initial evaluation unless otherwise dated)   PATIENT SURVEYS:  FOTO 59% function  COGNITION: Overall cognitive status: Within functional limits for tasks assessed     SENSATION: WFL  POSTURE: No Significant postural limitations  PALPATION: TTP R medial femoral condyle; Patellar mobility WFL bilateral  LOWER EXTREMITY ROM:  Active ROM Right eval Left eval  Hip flexion    Hip extension    Hip abduction    Hip adduction    Hip internal rotation    Hip external rotation    Knee flexion 135 137  Knee extension 0 0  Ankle dorsiflexion    Ankle plantarflexion    Ankle inversion    Ankle eversion     (Blank rows = not tested) *= pain/symptoms  LOWER EXTREMITY  MMT:  MMT Right eval Left eval  Hip flexion 5 5  Hip extension 4+ 5  Hip abduction 4+ 5  Hip adduction    Hip internal rotation    Hip external rotation    Knee flexion 5 5  Knee extension 5 5  Ankle dorsiflexion 5 5  Ankle plantarflexion    Ankle inversion    Ankle eversion     (Blank rows = not tested) *= pain/symptoms    FUNCTIONAL TESTS:  5 times sit to stand: 12.42 seconds without UE use; R knee pain, slight decreased motor  control Forward step down test: R knee valgus and impaired motor control, symptoms, decreased eccentric quad/glute strength  GAIT: Distance walked:  100 feet Assistive device utilized: None Level of assistance: Complete Independence Comments: WFL   TODAY'S TREATMENT:                                                                                                                              DATE:  04/30/23 Step up 6 inch 1 x 10 Lateral step down 4 inch 2 x 10  Squat 2 x 10 Lateral stepping 5 x 8 feet  04/23/23 -treadmill 2. x 5.5 min for warm up - standing quad stretch x 20s x 2 each LE - sidelying hip abdct, 2 x 10 RLE, 1 x 10 - fig 4 bridge, 5 sec hold x 10 each LE - long sitting SLR-> abdct/addct 3 x 5 ,good challenge for RLE - standing mini squat with opp leg slide into abdct/ ext x 5 each side  - quadruped (knees on pillow on mat table) with LE ext x 3 each side (mild increase in pain with pressure on knees)  04/16/23 -Standing quad stretch x 20s each  -Rock tape application to R ant knee  - forward step up/step down, retro step up/down x 8 RLE, x 5 LLE (no increase in pain) - sidelying R hip abdct x 10, clams x 10, hip abdct with 3 pulses x 10 (req rest after 7) - SLR x 10 slow - Fig 4 bridge with 5 sec hold in ext    PATIENT EDUCATION:  Education details: exercise rationale and technique  04/30/23: HEP, cardiac vs msk symptoms Person educated: Patient Education method: Explanation, Demonstration,  Education comprehension: verbalized understanding, returned demonstration, verbal cues required, and tactile cues required  HOME EXERCISE PROGRAM: Access Code: [email or web address] URL: [email or web address]  ASSESSMENT:  CLINICAL IMPRESSION: Patient arrives late limiting session time today. Patient demonstrating dynamic knee valgus with lateral step down which improves with cueing. Educated patient on squat in non/minimal pain range. Patient will continue to benefit  from physical therapy in order to improve function and reduce impairment.    From initial evaluation: Patient a 53 y.o. y.o. male who was seen today for physical therapy evaluation and treatment for bilateral knee pain. Patient presents with pain limited deficits in bilateral knee strength, ROM, endurance, activity tolerance, and functional mobility  with ADL. Patient is having to modify and restrict ADL as indicated by outcome measure score as well as subjective information and objective measures which is affecting overall participation. Patient will benefit from skilled physical therapy in order to improve function and reduce impairment.  OBJECTIVE IMPAIRMENTS: decreased activity tolerance, decreased mobility, difficulty walking, decreased ROM, decreased strength, improper body mechanics, and pain.   ACTIVITY LIMITATIONS: carrying, lifting, bending, squatting, stairs, transfers, locomotion level, and caring for others  PARTICIPATION LIMITATIONS: meal prep, cleaning, laundry, shopping, community activity, occupation, and yard work  PERSONAL FACTORS: Fitness and Time since onset of injury/illness/exacerbation are also affecting patient's functional outcome.   REHAB POTENTIAL: Good  CLINICAL DECISION MAKING: Stable/uncomplicated  EVALUATION COMPLEXITY: Low   GOALS: Goals reviewed with patient? Yes  SHORT TERM GOALS: Target date: 04/17/2023    Patient will be independent with HEP in order to improve functional outcomes. Baseline: Goal status:IN PROGRESS - 04/23/23  2.  Patient will report at least 25% improvement in symptoms for improved quality of life. Baseline: Goal status:IN PROGRESS - 04/23/23   LONG TERM GOALS: Target date: 05/15/2023    Patient will report at least 75% improvement in symptoms for improved quality of life. Baseline:  Goal status: INITIAL  2.  Patient will improve FOTO score to predicted outcomes in order to indicate improved tolerance to activity. Baseline:  59% function Goal status: INITIAL  3.  Patient will be able to navigate stairs with reciprocal pattern without compensation in order to demonstrate improved LE strength. Baseline:  Goal status: INITIAL  4.  Patient will be able to perform forward step down test without deviation in order to demonstrate improved LE strength and motor control.  Baseline:  Goal status: INITIAL  5.  Patient will be able to complete 5x STS in under 11.4 seconds in order to reduce the risk of falls. Baseline:  Goal status: INITIAL  6.  Patient will demonstrate grade of 5/5 MMT grade in all tested musculature as evidence of improved strength to assist with stair ambulation and gait.   Baseline:  Goal status: INITIAL   PLAN:  PT FREQUENCY: 1-2x/week  PT DURATION: 8 weeks  PLANNED INTERVENTIONS: 97164- PT Re-evaluation, 97110-Therapeutic exercises, 97530- Therapeutic activity, 97112- Neuromuscular re-education, 97535- Self Care, 02859- Manual therapy, [phone]- Gait training, [phone]- Orthotic Fit/training, [phone]- Canalith repositioning, [id number]- Aquatic Therapy, [phone]- Splinting, Patient/Family education, Balance training, Stair training, Taping, Dry Needling, Joint mobilization, Joint manipulation, Spinal manipulation, Spinal mobilization, Scar mobilization, and DME instructions.  PLAN FOR NEXT SESSION: quad and glute strength and motor control   Prentice GORMAN Stains, PT 04/30/2023, 4:00 PM  [facility] [address] [place], [place], [address] Phone: [phone]   Fax:  [phone]

## 2023-05-06 ENCOUNTER — Encounter (HOSPITAL_BASED_OUTPATIENT_CLINIC_OR_DEPARTMENT_OTHER): Payer: Self-pay | Admitting: Physical Therapy

## 2023-05-07 ENCOUNTER — Encounter (HOSPITAL_BASED_OUTPATIENT_CLINIC_OR_DEPARTMENT_OTHER): Payer: BC Managed Care – PPO | Admitting: Physical Therapy

## 2023-05-12 ENCOUNTER — Encounter (HOSPITAL_BASED_OUTPATIENT_CLINIC_OR_DEPARTMENT_OTHER): Payer: Self-pay | Admitting: Physical Therapy

## 2023-05-12 ENCOUNTER — Ambulatory Visit (HOSPITAL_BASED_OUTPATIENT_CLINIC_OR_DEPARTMENT_OTHER): Payer: 59 | Admitting: Physical Therapy

## 2023-05-12 DIAGNOSIS — M6281 Muscle weakness (generalized): Secondary | ICD-10-CM

## 2023-05-12 DIAGNOSIS — R2689 Other abnormalities of gait and mobility: Secondary | ICD-10-CM

## 2023-05-12 DIAGNOSIS — M25561 Pain in right knee: Secondary | ICD-10-CM | POA: Diagnosis not present

## 2023-05-12 NOTE — Therapy (Signed)
OUTPATIENT PHYSICAL THERAPY LOWER EXTREMITY TREATMENT   Patient Name: Derek Cowan MRN: [id number] DOB:Apr 18, 1970, 53 y.o., male Today's Date: 05/12/2023  END OF SESSION:  PT End of Session - 05/12/23 1410     Visit Number 5    Number of Visits 16    Date for PT Re-Evaluation 05/15/23    Authorization Type [employer]    PT Start Time 1404    PT Stop Time 1444    PT Time Calculation (min) 40 min    Behavior During Therapy [facility] for tasks assessed/performed              Past Medical History:  Diagnosis Date   Eczema    Impaired fasting blood sugar    Vitamin D deficiency    Past Surgical History:  Procedure Laterality Date   HAND SURGERY  2021   tumor of left hand, benign   Patient Active Problem List   Diagnosis Date Noted   Vitamin D deficiency 06/30/2022   Paresthesia 11/20/2020   Varicose veins of both lower extremities 11/20/2020   Skin tags, multiple acquired 11/20/2020   Hair loss 11/20/2020   Alkaline phosphatase elevation 03/02/2020   Impaired fasting blood sugar 03/02/2020   BMI 29.0-29.9,adult 03/02/2020   Encounter for health maintenance examination in adult 11/18/2019   Screening for lipid disorders 11/18/2019   Screening for prostate cancer 11/18/2019   Screen for colon cancer 11/18/2019   Screen for STD (sexually transmitted disease) 11/18/2019   Chronic nonintractable headache 09/28/2019   Snoring 09/28/2019   Ganglion cyst of finger of left hand 12/24/2018   Need for influenza vaccination 12/24/2018   Eczema 12/24/2018   Influenza vaccination declined 11/27/2017    PCP: Jac Canavan, PA-C  REFERRING PROVIDER: Brenton Grills, MD  REFERRING DIAG: [phone] (ICD-10-CM) - Chronic pain of right knee; Evaluate and treat for right quadriceps tendon calcific tendinitis.  THERAPY DIAG:  Right knee pain, unspecified chronicity  Muscle weakness (generalized)  Other abnormalities of gait and mobility  Rationale for Evaluation and Treatment:  Rehabilitation  ONSET DATE: Feb 2024  SUBJECTIVE:   SUBJECTIVE STATEMENT: Pt reports he is having less knee pain. Has been compliant with HEP.  Has not played piano in a couple of weeks.     PERTINENT HISTORY: Hx knee pain PAIN:  Are you having pain? No : NPRS scale: 0/10 Pain location: R>L knee Pain description: sharp Aggravating factors: stairs, piano pedal, driving Relieving factors: brace  PRECAUTIONS: None  WEIGHT BEARING RESTRICTIONS: No  FALLS:  Has patient fallen in last 6 months? No  OCCUPATION: Education  PLOF: Independent  PATIENT GOALS: getting some relief/eliminate pain   OBJECTIVE: (objective measures from initial evaluation unless otherwise dated)   PATIENT SURVEYS:  FOTO 59% function  COGNITION: Overall cognitive status: Within functional limits for tasks assessed     SENSATION: WFL  POSTURE: No Significant postural limitations  PALPATION: TTP R medial femoral condyle; Patellar mobility WFL bilateral  LOWER EXTREMITY ROM:  Active ROM Right eval Left eval  Hip flexion    Hip extension    Hip abduction    Hip adduction    Hip internal rotation    Hip external rotation    Knee flexion 135 137  Knee extension 0 0  Ankle dorsiflexion    Ankle plantarflexion    Ankle inversion    Ankle eversion     (Blank rows = not tested) *= pain/symptoms  LOWER EXTREMITY MMT:  MMT Right eval Left eval  Hip  flexion 5 5  Hip extension 4+ 5  Hip abduction 4+ 5  Hip adduction    Hip internal rotation    Hip external rotation    Knee flexion 5 5  Knee extension 5 5  Ankle dorsiflexion 5 5  Ankle plantarflexion    Ankle inversion    Ankle eversion     (Blank rows = not tested) *= pain/symptoms    FUNCTIONAL TESTS:  5 times sit to stand: 12.42 seconds without UE use; R knee pain, slight decreased motor control Forward step down test: R knee valgus and impaired motor control, symptoms, decreased eccentric quad/glute strength  05/12/23:  5x STS:12.18s  GAIT: Distance walked:  100 feet Assistive device utilized: None Level of assistance: Complete Independence Comments: WFL   TODAY'S TREATMENT:                                                                                                                              DATE:  [facility] Adult PT Treatment:                                                DATE: 05/12/23  Therapeutic Exercise: Upright bike L3: 5 min (mild R knee pain, 3/10) Standing quad stretch x 20 sec each  Recumbent bike L3 x 1.5 min (no pain)  LF leg press 35#,  2 x 10 LF knee ext 15# x 3 (R knee pain, 2/10) LF calf ext 10# x 10, 20# x 10  LF hip abdct (seated) 55# 3 x 10   04/30/23 Step up 6 inch 1 x 10 Lateral step down 4 inch 2 x 10  Squat 2 x 10 Lateral stepping 5 x 8 feet  04/23/23 -treadmill 2. x 5.5 min for warm up - standing quad stretch x 20s x 2 each LE - sidelying hip abdct, 2 x 10 RLE, 1 x 10 - fig 4 bridge, 5 sec hold x 10 each LE - long sitting SLR-> abdct/addct 3 x 5 ,good challenge for RLE - standing mini squat with opp leg slide into abdct/ ext x 5 each side  - quadruped (knees on pillow on mat table) with LE ext x 3 each side (mild increase in pain with pressure on knees)  04/16/23 -Standing quad stretch x 20s each  -Rock tape application to R ant knee  - forward step up/step down, retro step up/down x 8 RLE, x 5 LLE (no increase in pain) - sidelying R hip abdct x 10, clams x 10, hip abdct with 3 pulses x 10 (req rest after 7) - SLR x 10 slow - Fig 4 bridge with 5 sec hold in ext    PATIENT EDUCATION:  Education details: exercise rationale and technique  04/30/23: HEP, cardiac vs msk symptoms Person educated: Patient Education method: Explanation, Demonstration,  Education comprehension: verbalized understanding, returned demonstration, verbal cues required, and tactile cues required  HOME EXERCISE PROGRAM: Access Code: [email or web address] URL:  [email or web address]  ASSESSMENT:  CLINICAL IMPRESSION: Positive response to HEP thus far; pt reporting improved LE functional strength and less pain.  Trialed some gym equipment as pt is interested in transitioning to gym (for more than just treadmill). He had some increase in pain with upright bike, but not recumbent.  Also had pain with knee ext machine; limited the reps. Will continue to progress as tolerated.  Patient will continue to benefit from physical therapy in order to improve function and reduce impairment. Therapist to check STG next session.    From initial evaluation: Patient a 53 y.o. y.o. male who was seen today for physical therapy evaluation and treatment for bilateral knee pain. Patient presents with pain limited deficits in bilateral knee strength, ROM, endurance, activity tolerance, and functional mobility with ADL. Patient is having to modify and restrict ADL as indicated by outcome measure score as well as subjective information and objective measures which is affecting overall participation. Patient will benefit from skilled physical therapy in order to improve function and reduce impairment.  OBJECTIVE IMPAIRMENTS: decreased activity tolerance, decreased mobility, difficulty walking, decreased ROM, decreased strength, improper body mechanics, and pain.   ACTIVITY LIMITATIONS: carrying, lifting, bending, squatting, stairs, transfers, locomotion level, and caring for others  PARTICIPATION LIMITATIONS: meal prep, cleaning, laundry, shopping, community activity, occupation, and yard work  PERSONAL FACTORS: Fitness and Time since onset of injury/illness/exacerbation are also affecting patient's functional outcome.   REHAB POTENTIAL: Good  CLINICAL DECISION MAKING: Stable/uncomplicated  EVALUATION COMPLEXITY: Low   GOALS: Goals reviewed with patient? Yes  SHORT TERM GOALS: Target date: 04/17/2023    Patient will be independent with HEP in order to  improve functional outcomes. Baseline: Goal status:IN PROGRESS - 04/23/23  2.  Patient will report at least 25% improvement in symptoms for improved quality of life. Baseline:60%  Goal status:MET - 05/12/23   LONG TERM GOALS: Target date: 05/15/2023    Patient will report at least 75% improvement in symptoms for improved quality of life. Baseline:  Goal status: in Progress - 05/12/23  2.  Patient will improve FOTO score to predicted outcomes in order to indicate improved tolerance to activity. Baseline: 59% function Goal status: INITIAL  3.  Patient will be able to navigate stairs with reciprocal pattern without compensation in order to demonstrate improved LE strength. Baseline:  Goal status: INITIAL  4.  Patient will be able to perform forward step down test without deviation in order to demonstrate improved LE strength and motor control.  Baseline:  Goal status: INITIAL  5.  Patient will be able to complete 5x STS in under 11.4 seconds in order to reduce the risk of falls. Baseline:  Goal status: INITIAL  6.  Patient will demonstrate grade of 5/5 MMT grade in all tested musculature as evidence of improved strength to assist with stair ambulation and gait.   Baseline:  Goal status: INITIAL   PLAN:  PT FREQUENCY: 1-2x/week  PT DURATION: 8 weeks  PLANNED INTERVENTIONS: 97164- PT Re-evaluation, 97110-Therapeutic exercises, 97530- Therapeutic activity, 97112- Neuromuscular re-education, 97535- Self Care, 47829- Manual therapy, [phone]- Gait training, [phone]- Orthotic Fit/training, [phone]- Canalith repositioning, [id number]- Aquatic Therapy, [phone]- Splinting, Patient/Family education, Balance training, Stair training, Taping, Dry Needling, Joint mobilization, Joint manipulation, Spinal manipulation, Spinal mobilization, Scar mobilization, and DME instructions.  PLAN FOR NEXT SESSION: quad and glute strength and motor control   Victorino Dike  Harlow Mares, PTA 05/12/23 6:09 PM [facility] [address] [place], [place], [address] Phone: [phone]   Fax:  [phone]

## 2023-05-13 ENCOUNTER — Encounter (HOSPITAL_BASED_OUTPATIENT_CLINIC_OR_DEPARTMENT_OTHER): Payer: Self-pay | Admitting: Physical Therapy

## 2023-05-14 ENCOUNTER — Encounter (HOSPITAL_BASED_OUTPATIENT_CLINIC_OR_DEPARTMENT_OTHER): Payer: BC Managed Care – PPO | Admitting: Physical Therapy

## 2023-05-19 ENCOUNTER — Encounter: Payer: Self-pay | Admitting: Medical

## 2023-05-19 ENCOUNTER — Ambulatory Visit: Payer: 59 | Admitting: Medical

## 2023-05-19 VITALS — BP 130/80 | HR 95 | Ht 69.0 in | Wt 189.8 lb

## 2023-05-19 DIAGNOSIS — R21 Rash and other nonspecific skin eruption: Secondary | ICD-10-CM | POA: Diagnosis not present

## 2023-05-19 DIAGNOSIS — N401 Enlarged prostate with lower urinary tract symptoms: Secondary | ICD-10-CM | POA: Insufficient documentation

## 2023-05-19 DIAGNOSIS — I8393 Asymptomatic varicose veins of bilateral lower extremities: Secondary | ICD-10-CM | POA: Diagnosis not present

## 2023-05-19 DIAGNOSIS — R39198 Other difficulties with micturition: Secondary | ICD-10-CM | POA: Insufficient documentation

## 2023-05-19 DIAGNOSIS — I10 Essential (primary) hypertension: Secondary | ICD-10-CM | POA: Insufficient documentation

## 2023-05-19 DIAGNOSIS — L309 Dermatitis, unspecified: Secondary | ICD-10-CM | POA: Diagnosis not present

## 2023-05-19 MED ORDER — TERAZOSIN HCL 1 MG PO CAPS: 1.0000 mg | ORAL_CAPSULE | Freq: Every day | ORAL | 2 refills | Status: DC

## 2023-05-19 NOTE — Patient Instructions (Signed)
 Recommendations: Today through Thursday, continue Losartan one tablet daily Friday, use 1/2 tablet of Losartan in the morning Then Friday night begin Hytrin 1mg  at bedtime Then continue Hytrin daily at bedtime Monitor blood pressure readings Goals 120/70.    BP of 100/60 or less would be too low BP >140/90 too high Call or recheck in 1 months to make sure BPs are at goal and to see if you are getting improvements in the urine symptoms

## 2023-05-19 NOTE — Progress Notes (Signed)
 Subjective:  Derek Cowan is a 53 y.o. male who presents for Chief Complaint  Patient presents with   Hypertension    BP follow up. Had a few numbers that were up, thinks related to celebrating birthday. 135/92, 126/98, 143/99. Asking for refill on fluocinonide.      Here for recheck.  I saw him about 3 weeks ago for new hypertension diagnosis.  He is using the losartan 25 mg daily but still getting some elevated blood pressures.  He still has urinary symptoms of weaker urine stream, hard to pass urine at times, sometimes getting up at night to urinate, urine slow to start in the mornings.  He has ongoing chronic skin issues and dermatitis on the lower legs despite the creams.  Does not seem to ever get better  No syncope, no palpitations, no edema  No other aggravating or relieving factors.    No other c/o.  Past Medical History:  Diagnosis Date   Eczema    Impaired fasting blood sugar    Vitamin D deficiency    Current Outpatient Medications on File Prior to Visit  Medication Sig Dispense Refill   cholecalciferol (VITAMIN D3) 25 MCG (1000 UNIT) tablet Take 1 tablet (1,000 Units total) by mouth daily. 90 tablet 3   clobetasol ointment (TEMOVATE) 0.05 % Apply 1 Application topically 2 (two) times daily. 45 g 0   fluocinonide cream (LIDEX) 0.05 % Apply topically.     losartan (COZAAR) 25 MG tablet Take 25 mg by mouth daily.     betamethasone valerate ointment (VALISONE) 0.1 % Apply 1 Application topically 2 (two) times daily. (Patient not taking: Reported on 05/19/2023) 45 g 0   Crisaborole (EUCRISA) 2 % OINT APPLY TOPICALLY ONCE DAILY (Patient not taking: Reported on 05/19/2023)     omeprazole (PRILOSEC) 40 MG capsule Take 1 capsule (40 mg total) by mouth daily. (Patient not taking: Reported on 05/19/2023) 30 capsule 0   No current facility-administered medications on file prior to visit.   Family History  Problem Relation Age of Onset   Lupus Mother    Lung disease Mother         ARDS   Hypertension Mother    Diabetes Father    Prostatitis Father    Arrhythmia Sister    Miscarriages / Stillbirths Sister    Hypertension Sister    Heart disease Paternal Aunt    Cancer Paternal Grandfather        lung   Stroke Neg Hx      The following portions of the patient's history were reviewed and updated as appropriate: allergies, current medications, past family history, past medical history, past social history, past surgical history and problem list.  ROS Otherwise as in subjective above    Objective: BP 130/80   Pulse 95   Ht 5\' 9"  (1.753 m)   Wt 189 lb 12.8 oz (86.1 kg)   SpO2 98%   BMI 28.03 kg/m   Wt Readings from Last 3 Encounters:  05/19/23 189 lb 12.8 oz (86.1 kg)  04/28/23 185 lb (83.9 kg)  02/10/23 189 lb (85.7 kg)   BP Readings from Last 3 Encounters:  05/19/23 130/80  04/28/23 (!) 132/92  02/10/23 138/86   General appearance: alert, no distress, well developed, well nourished Neck: supple, no lymphadenopathy, no thyromegaly, no masses Heart: RRR, normal S1, S2, no murmurs Lungs: CTA bilaterally, no wheezes, rhonchi, or rales Arms and chest nontender no obvious deformity Pulses: 2+ radial pulses, 2+ pedal pulses,  normal cap refill Ext: no edema There is some mild varicose veins of right lower leg There is some patches of flat brown coloration throughout the right lower leg and a few on the left lower leg    Assessment: Encounter Diagnoses  Name Primary?   Essential hypertension, benign Yes   Skin rash    Dermatitis    Varicose veins of both lower extremities, unspecified whether complicated    Decreased urine stream    BPH with lower urinary tract symptoms without urinary obstruction       Plan: HTN -new diagnosis with the last month.  Temporarily we will start losartan 25 mg daily and change to Hytrin.  Since he has some BPH symptoms and send blood pressure not quite to goal lets give Hytrin a try for those benefits.   Continue to monitor blood pressure  Chronic dermatitis of legs-referral to dermatology for different second opinion.  Advise daily moisturizing lotion.  Use clobetasol less frequently  Skin lesion of scalp-begin Selsun Blue once a week as discussed.  Follow-up with dermatology  Given his BPH and urinary symptoms-begin trial of Hytrin  Varicose veins-continue compression hose  Kermit was seen today for hypertension.  Diagnoses and all orders for this visit:  Essential hypertension, benign -     Ambulatory referral to Vascular Surgery  Skin rash -     Ambulatory referral to Dermatology -     Ambulatory referral to Vascular Surgery  Dermatitis -     Ambulatory referral to Dermatology -     Ambulatory referral to Vascular Surgery  Varicose veins of both lower extremities, unspecified whether complicated -     Ambulatory referral to Vascular Surgery  Decreased urine stream  BPH with lower urinary tract symptoms without urinary obstruction  Other orders -     terazosin (HYTRIN) 1 MG capsule; Take 1 capsule (1 mg total) by mouth at bedtime.     Follow up: 60mo

## 2023-05-20 ENCOUNTER — Ambulatory Visit (HOSPITAL_BASED_OUTPATIENT_CLINIC_OR_DEPARTMENT_OTHER): Payer: 59 | Admitting: Physical Therapy

## 2023-05-20 ENCOUNTER — Encounter (HOSPITAL_BASED_OUTPATIENT_CLINIC_OR_DEPARTMENT_OTHER): Payer: Self-pay | Admitting: Physical Therapy

## 2023-05-20 DIAGNOSIS — R29898 Other symptoms and signs involving the musculoskeletal system: Secondary | ICD-10-CM

## 2023-05-20 DIAGNOSIS — R2689 Other abnormalities of gait and mobility: Secondary | ICD-10-CM

## 2023-05-20 DIAGNOSIS — M25562 Pain in left knee: Secondary | ICD-10-CM

## 2023-05-20 DIAGNOSIS — M25561 Pain in right knee: Secondary | ICD-10-CM

## 2023-05-20 DIAGNOSIS — M6281 Muscle weakness (generalized): Secondary | ICD-10-CM

## 2023-05-20 NOTE — Therapy (Signed)
 OUTPATIENT PHYSICAL THERAPY LOWER EXTREMITY TREATMENT   Patient Name: Derek Cowan MRN: [id number] DOB:September 13, 1970, 53 y.o., male Today's Date: 05/20/2023  Progress Note   Reporting Period 03/20/23 to 05/20/23   See note below for Objective Data and Assessment of Progress/Goals   END OF SESSION:  PT End of Session - 05/20/23 0943     Visit Number 6    Number of Visits 20    Date for PT Re-Evaluation 06/17/23    Authorization Type BC[employer]    PT Start Time [phone]   delayed check in   PT Stop Time 1015    PT Time Calculation (min) 32 min    Activity Tolerance Patient tolerated treatment well    Behavior During Therapy [facility] for tasks assessed/performed              Past Medical History:  Diagnosis Date   Eczema    Impaired fasting blood sugar    Vitamin D deficiency    Past Surgical History:  Procedure Laterality Date   HAND SURGERY  2021   tumor of left hand, benign   Patient Active Problem List   Diagnosis Date Noted   Essential hypertension, benign 05/19/2023   Skin rash 05/19/2023   Decreased urine stream 05/19/2023   BPH with lower urinary tract symptoms without urinary obstruction 05/19/2023   Dermatitis 05/19/2023   Vitamin D deficiency 06/30/2022   Paresthesia 11/20/2020   Varicose veins of both lower extremities 11/20/2020   Skin tags, multiple acquired 11/20/2020   Hair loss 11/20/2020   Alkaline phosphatase elevation 03/02/2020   Impaired fasting blood sugar 03/02/2020   BMI 29.0-29.9,adult 03/02/2020   Encounter for health maintenance examination in adult 11/18/2019   Screening for lipid disorders 11/18/2019   Screening for prostate cancer 11/18/2019   Screen for colon cancer 11/18/2019   Screen for STD (sexually transmitted disease) 11/18/2019   Chronic nonintractable headache 09/28/2019   Snoring 09/28/2019   Ganglion cyst of finger of left hand 12/24/2018   Need for influenza vaccination 12/24/2018   Eczema 12/24/2018   Influenza vaccination  declined 11/27/2017    PCP: Jac Canavan, PA-C  REFERRING PROVIDER: Brenton Grills, MD  REFERRING DIAG: [phone] (ICD-10-CM) - Chronic pain of right knee; Evaluate and treat for right quadriceps tendon calcific tendinitis.  THERAPY DIAG:  Right knee pain, unspecified chronicity  Muscle weakness (generalized)  Other abnormalities of gait and mobility  Other symptoms and signs involving the musculoskeletal system  Left knee pain, unspecified chronicity  Rationale for Evaluation and Treatment: Rehabilitation  ONSET DATE: Feb 2024  SUBJECTIVE:   SUBJECTIVE STATEMENT: Pt reports doing HEP. Had to ice after last session. Feels that PT has been helping with symptoms. Patient states 85-90% improvement/ current functional status with knee symptoms. Improved ability to go down stairs. Maybe wants come to PT a few more times.   PERTINENT HISTORY: Hx knee pain PAIN:  Are you having pain? No : NPRS scale: 0/10 Pain location: R>L knee Pain description: sharp Aggravating factors: stairs, piano pedal, driving Relieving factors: brace  PRECAUTIONS: None  WEIGHT BEARING RESTRICTIONS: No  FALLS:  Has patient fallen in last 6 months? No  OCCUPATION: Education  PLOF: Independent  PATIENT GOALS: getting some relief/eliminate pain   OBJECTIVE: (objective measures from initial evaluation unless otherwise dated)   PATIENT SURVEYS:  FOTO 59% function  05/20/23 67% function  COGNITION: Overall cognitive status: Within functional limits for tasks assessed     SENSATION: WFL  POSTURE: No Significant postural  limitations  PALPATION: TTP R medial femoral condyle; Patellar mobility WFL bilateral  LOWER EXTREMITY ROM:  Active ROM Right eval Left eval  Hip flexion    Hip extension    Hip abduction    Hip adduction    Hip internal rotation    Hip external rotation    Knee flexion 135 137  Knee extension 0 0  Ankle dorsiflexion    Ankle plantarflexion    Ankle  inversion    Ankle eversion     (Blank rows = not tested) *= pain/symptoms  LOWER EXTREMITY MMT:  MMT Right eval Left eval Right 05/20/23 Left 05/20/23  Hip flexion 5 5    Hip extension 4+ 5 5 5   Hip abduction 4+ 5 5 5   Hip adduction      Hip internal rotation      Hip external rotation      Knee flexion 5 5    Knee extension 5 5    Ankle dorsiflexion 5 5    Ankle plantarflexion      Ankle inversion      Ankle eversion       (Blank rows = not tested) *= pain/symptoms    FUNCTIONAL TESTS:  5 times sit to stand: 12.42 seconds without UE use; R knee pain, slight decreased motor control Forward step down test: R knee valgus and impaired motor control, symptoms, decreased eccentric quad/glute strength  05/12/23: 5x STS:12.18s  GAIT: Distance walked:  100 feet Assistive device utilized: None Level of assistance: Complete Independence Comments: WFL  Reassessment 05/20/23: FUNCTIONAL TESTS:  5 times sit to stand: 7.42 seconds without UE use; R knee pain very slight Forward step down test:  improved mechanics, mild decrease in R quad/hip strength with decreased motor control, mild R knee symptoms Stair: 7 inch alternating, good mechanics.   TODAY'S TREATMENT:                                                                                                                              DATE:  05/20/23 Reassessment Education and discussion of POC Lateral step down 6 inch 2 x 10   [facility] Adult PT Treatment:                                                DATE: 05/12/23  Therapeutic Exercise: Upright bike L3: 5 min (mild R knee pain, 3/10) Standing quad stretch x 20 sec each  Recumbent bike L3 x 1.5 min (no pain)  LF leg press 35#,  2 x 10 LF knee ext 15# x 3 (R knee pain, 2/10) LF calf ext 10# x 10, 20# x 10  LF hip abdct (seated) 55# 3 x 10   04/30/23 Step up 6 inch 1 x 10 Lateral step down 4 inch 2 x 10  Squat  2 x 10 Lateral stepping 5 x 8 feet  04/23/23 -treadmill 2.  x 5.5 min for warm up - standing quad stretch x 20s x 2 each LE - sidelying hip abdct, 2 x 10 RLE, 1 x 10 - fig 4 bridge, 5 sec hold x 10 each LE - long sitting SLR-> abdct/addct 3 x 5 ,good challenge for RLE - standing mini squat with opp leg slide into abdct/ ext x 5 each side  - quadruped (knees on pillow on mat table) with LE ext x 3 each side (mild increase in pain with pressure on knees)  04/16/23 -Standing quad stretch x 20s each  -Rock tape application to R ant knee  - forward step up/step down, retro step up/down x 8 RLE, x 5 LLE (no increase in pain) - sidelying R hip abdct x 10, clams x 10, hip abdct with 3 pulses x 10 (req rest after 7) - SLR x 10 slow - Fig 4 bridge with 5 sec hold in ext    PATIENT EDUCATION:  Education details: exercise rationale and technique  04/30/23: HEP, cardiac vs msk symptoms 05/20/23: reassessment findings, POC, HEP Person educated: Patient Education method: Explanation, Demonstration,  Education comprehension: verbalized understanding, returned demonstration, verbal cues required, and tactile cues required  HOME EXERCISE PROGRAM: Access Code: [email or web address] URL: [email or web address]  ASSESSMENT:  CLINICAL IMPRESSION: Patient has met 2/2 short term goals and 4/6 long term goals with ability to complete HEP and improvement in symptoms, strength, activity tolerance, and functional mobility. Remaining goals not met due to continued deficits in symptoms, strength, motor control. Patient has made good progress toward remaining goals. Extending POC 1x/week for 4 weeks to continue to work toward remaining goals. Patient will continue to benefit from skilled physical therapy in order to improve function and reduce impairment.     From initial evaluation: Patient a 53 y.o. y.o. male who was seen today for physical therapy evaluation and treatment for bilateral knee pain. Patient presents with pain limited deficits in bilateral knee strength, ROM,  endurance, activity tolerance, and functional mobility with ADL. Patient is having to modify and restrict ADL as indicated by outcome measure score as well as subjective information and objective measures which is affecting overall participation. Patient will benefit from skilled physical therapy in order to improve function and reduce impairment.  OBJECTIVE IMPAIRMENTS: decreased activity tolerance, decreased mobility, difficulty walking, decreased ROM, decreased strength, improper body mechanics, and pain.   ACTIVITY LIMITATIONS: carrying, lifting, bending, squatting, stairs, transfers, locomotion level, and caring for others  PARTICIPATION LIMITATIONS: meal prep, cleaning, laundry, shopping, community activity, occupation, and yard work  PERSONAL FACTORS: Fitness and Time since onset of injury/illness/exacerbation are also affecting patient's functional outcome.   REHAB POTENTIAL: Good  CLINICAL DECISION MAKING: Stable/uncomplicated  EVALUATION COMPLEXITY: Low   GOALS: Goals reviewed with patient? Yes  SHORT TERM GOALS: Target date: 04/17/2023    Patient will be independent with HEP in order to improve functional outcomes. Baseline: Goal status:MET  2.  Patient will report at least 25% improvement in symptoms for improved quality of life. Baseline:60%  Goal status:MET - 05/12/23   LONG TERM GOALS: Target date: 05/15/2023    Patient will report at least 75% improvement in symptoms for improved quality of life. Baseline: 85-90%  Goal status: MET  2.  Patient will improve FOTO score to predicted outcomes in order to indicate improved tolerance to activity. Baseline: 59% function Goal status: INITIAL  3.  Patient will be able to navigate  stairs with reciprocal pattern without compensation in order to demonstrate improved LE strength. Baseline:  Goal status: MET  4.  Patient will be able to perform forward step down test without deviation in order to demonstrate improved LE  strength and motor control.  Baseline:  Goal status: INITIAL  5.  Patient will be able to complete 5x STS in under 11.4 seconds in order to reduce the risk of falls. Baseline:  Goal status: MET  6.  Patient will demonstrate grade of 5/5 MMT grade in all tested musculature as evidence of improved strength to assist with stair ambulation and gait.   Baseline:  Goal status: MET   PLAN:  PT FREQUENCY: 1x/week  PT DURATION: 4 weeks  PLANNED INTERVENTIONS: 97164- PT Re-evaluation, 97110-Therapeutic exercises, 97530- Therapeutic activity, 97112- Neuromuscular re-education, 97535- Self Care, 13086- Manual therapy, [phone]- Gait training, [phone]- Orthotic Fit/training, [phone]- Canalith repositioning, [id number]- Aquatic Therapy, [phone]- Splinting, Patient/Family education, Balance training, Stair training, Taping, Dry Needling, Joint mobilization, Joint manipulation, Spinal manipulation, Spinal mobilization, Scar mobilization, and DME instructions.  PLAN FOR NEXT SESSION: quad and glute strength and motor control    Wyman Songster, PT 05/20/2023, 10:16 AM  [facility] [address] [place], [place], [address] Phone: [phone]   Fax:  [phone]

## 2023-05-21 ENCOUNTER — Encounter (HOSPITAL_BASED_OUTPATIENT_CLINIC_OR_DEPARTMENT_OTHER): Payer: BC Managed Care – PPO | Admitting: Physical Therapy

## 2023-05-26 ENCOUNTER — Ambulatory Visit (HOSPITAL_BASED_OUTPATIENT_CLINIC_OR_DEPARTMENT_OTHER): Payer: 59 | Attending: Family Medicine | Admitting: Physical Therapy

## 2023-05-26 ENCOUNTER — Encounter (HOSPITAL_BASED_OUTPATIENT_CLINIC_OR_DEPARTMENT_OTHER): Payer: Self-pay | Admitting: Physical Therapy

## 2023-05-26 DIAGNOSIS — I8393 Asymptomatic varicose veins of bilateral lower extremities: Secondary | ICD-10-CM | POA: Insufficient documentation

## 2023-05-26 DIAGNOSIS — M6281 Muscle weakness (generalized): Secondary | ICD-10-CM | POA: Diagnosis present

## 2023-05-26 DIAGNOSIS — R29898 Other symptoms and signs involving the musculoskeletal system: Secondary | ICD-10-CM

## 2023-05-26 DIAGNOSIS — M25562 Pain in left knee: Secondary | ICD-10-CM | POA: Diagnosis present

## 2023-05-26 DIAGNOSIS — R2689 Other abnormalities of gait and mobility: Secondary | ICD-10-CM | POA: Diagnosis present

## 2023-05-26 DIAGNOSIS — M25561 Pain in right knee: Secondary | ICD-10-CM

## 2023-05-26 NOTE — Therapy (Signed)
 OUTPATIENT PHYSICAL THERAPY LOWER EXTREMITY TREATMENT   Patient Name: Derek Cowan MRN: [id number] DOB:08-16-1970, 53 y.o., male Today's Date: 05/26/2023   END OF SESSION:  PT End of Session - 05/26/23 1502     Visit Number 7    Number of Visits 20    Date for PT Re-Evaluation 06/17/23    Authorization Type [employer]    PT Start Time 1451    PT Stop Time 1529    PT Time Calculation (min) 38 min    Activity Tolerance Patient tolerated treatment well    Behavior During Therapy WFL for tasks assessed/performed               Past Medical History:  Diagnosis Date   Eczema    Impaired fasting blood sugar    Vitamin D deficiency    Past Surgical History:  Procedure Laterality Date   HAND SURGERY  2021   tumor of left hand, benign   Patient Active Problem List   Diagnosis Date Noted   Essential hypertension, benign 05/19/2023   Skin rash 05/19/2023   Decreased urine stream 05/19/2023   BPH with lower urinary tract symptoms without urinary obstruction 05/19/2023   Dermatitis 05/19/2023   Vitamin D deficiency 06/30/2022   Paresthesia 11/20/2020   Varicose veins of both lower extremities 11/20/2020   Skin tags, multiple acquired 11/20/2020   Hair loss 11/20/2020   Alkaline phosphatase elevation 03/02/2020   Impaired fasting blood sugar 03/02/2020   BMI 29.0-29.9,adult 03/02/2020   Encounter for health maintenance examination in adult 11/18/2019   Screening for lipid disorders 11/18/2019   Screening for prostate cancer 11/18/2019   Screen for colon cancer 11/18/2019   Screen for STD (sexually transmitted disease) 11/18/2019   Chronic nonintractable headache 09/28/2019   Snoring 09/28/2019   Ganglion cyst of finger of left hand 12/24/2018   Need for influenza vaccination 12/24/2018   Eczema 12/24/2018   Influenza vaccination declined 11/27/2017    PCP: Jac Canavan, PA-C  REFERRING PROVIDER: Brenton Grills, MD  REFERRING DIAG: [phone] (ICD-10-CM) -  Chronic pain of right knee; Evaluate and treat for right quadriceps tendon calcific tendinitis.  THERAPY DIAG:  Right knee pain, unspecified chronicity  Left knee pain, unspecified chronicity  Muscle weakness (generalized)  Other symptoms and signs involving the musculoskeletal system  Rationale for Evaluation and Treatment: Rehabilitation  ONSET DATE: Feb 2024  SUBJECTIVE:   SUBJECTIVE STATEMENT: Pt reports he had some soreness in Rt knee after last session.   He bought an under desk bike; helps with knee pain and the tingling in patches of skin on LEs.    PERTINENT HISTORY: Hx knee pain PAIN:  Are you having pain? No : NPRS scale: 1-2/10 Pain location: R medial knee Pain description: dull Aggravating factors: stairs, piano pedal, driving Relieving factors: brace  PRECAUTIONS: None  WEIGHT BEARING RESTRICTIONS: No  FALLS:  Has patient fallen in last 6 months? No  OCCUPATION: Education  PLOF: Independent  PATIENT GOALS: getting some relief/eliminate pain   OBJECTIVE: (objective measures from initial evaluation unless otherwise dated)   PATIENT SURVEYS:  FOTO 59% function   05/20/23 67% function  COGNITION: Overall cognitive status: Within functional limits for tasks assessed     SENSATION: WFL  POSTURE: No Significant postural limitations  PALPATION: TTP R medial femoral condyle; Patellar mobility WFL bilateral  LOWER EXTREMITY ROM:  Active ROM Right eval Left eval  Hip flexion    Hip extension    Hip abduction  Hip adduction    Hip internal rotation    Hip external rotation    Knee flexion 135 137  Knee extension 0 0  Ankle dorsiflexion    Ankle plantarflexion    Ankle inversion    Ankle eversion     (Blank rows = not tested) *= pain/symptoms  LOWER EXTREMITY MMT:  MMT Right eval Left eval Right 05/20/23 Left 05/20/23  Hip flexion 5 5    Hip extension 4+ 5 5 5   Hip abduction 4+ 5 5 5   Hip adduction      Hip internal rotation       Hip external rotation      Knee flexion 5 5    Knee extension 5 5    Ankle dorsiflexion 5 5    Ankle plantarflexion      Ankle inversion      Ankle eversion       (Blank rows = not tested) *= pain/symptoms    FUNCTIONAL TESTS:  5 times sit to stand: 12.42 seconds without UE use; R knee pain, slight decreased motor control Forward step down test: R knee valgus and impaired motor control, symptoms, decreased eccentric quad/glute strength  05/12/23: 5x STS:12.18s  GAIT: Distance walked:  100 feet Assistive device utilized: None Level of assistance: Complete Independence Comments: WFL  Reassessment 05/20/23: FUNCTIONAL TESTS:  5 times sit to stand: 7.42 seconds without UE use; R knee pain very slight Forward step down test:  improved mechanics, mild decrease in R quad/hip strength with decreased motor control, mild R knee symptoms Stair: 7 inch alternating, good mechanics.   TODAY'S TREATMENT:                                                                                                                              [facility] Adult PT Treatment:                                                DATE: 05/26/23  Therapeutic Exercise: Recumbent bike L9-6 x 7 min (no pain)  Standing quad stretch x 20 sec each  LF calf ext 25# x 10, 30# 2 x 10  LF leg (knee) ext 15# 3 x 10 (no pain) Cybex leg press 70#,  3 x 10 Standing quad stretch x 15s x 2 each LE Standing hamstring x 20s each LE Standing calf stretch x 20s   DATE:  05/20/23 Reassessment Education and discussion of POC Lateral step down 6 inch 2 x 10   [facility] Adult PT Treatment:                                                DATE: 05/12/23  Therapeutic Exercise: Upright bike L3: 5 min (mild R knee pain, 3/10) Standing quad stretch x 20 sec each  Recumbent bike L3 x 1.5 min (no pain)  LF leg press 35#,  2 x 10 LF knee ext 15# x 3 (R knee pain, 2/10) LF calf ext 10# x 10, 20# x 10  LF hip abdct (seated) 55# 3 x 10     04/30/23 Step up 6 inch 1 x 10 Lateral step down 4 inch 2 x 10  Squat 2 x 10 Lateral stepping 5 x 8 feet  04/23/23 -treadmill 2. x 5.5 min for warm up - standing quad stretch x 20s x 2 each LE - sidelying hip abdct, 2 x 10 RLE, 1 x 10 - fig 4 bridge, 5 sec hold x 10 each LE - long sitting SLR-> abdct/addct 3 x 5 ,good challenge for RLE - standing mini squat with opp leg slide into abdct/ ext x 5 each side  - quadruped (knees on pillow on mat table) with LE ext x 3 each side (mild increase in pain with pressure on knees)  04/16/23 -Standing quad stretch x 20s each  -Rock tape application to R ant knee  - forward step up/step down, retro step up/down x 8 RLE, x 5 LLE (no increase in pain) - sidelying R hip abdct x 10, clams x 10, hip abdct with 3 pulses x 10 (req rest after 7) - SLR x 10 slow - Fig 4 bridge with 5 sec hold in ext    PATIENT EDUCATION:  Education details: exercise rationale and technique  04/30/23: HEP, cardiac vs msk symptoms 05/20/23: reassessment findings, POC, HEP Person educated: Patient Education method: Explanation, Demonstration,  Education comprehension: verbalized understanding, returned demonstration, verbal cues required, and tactile cues required  HOME EXERCISE PROGRAM: Access Code: [email or web address] URL: [email or web address]  ASSESSMENT:  CLINICAL IMPRESSION: Pt able to increase resistance on bicycle without discomfort, and able to complete knee ext exercise without pain. Pt reported reduction of knee pain by end of session.    POC= 1x/week for 4 weeks to continue to work toward remaining goals. Patient will continue to benefit from skilled physical therapy in order to improve function and reduce impairment.     From initial evaluation: Patient a 53 y.o. y.o. male who was seen today for physical therapy evaluation and treatment for bilateral knee pain. Patient presents with pain limited deficits in bilateral knee strength, ROM, endurance,  activity tolerance, and functional mobility with ADL. Patient is having to modify and restrict ADL as indicated by outcome measure score as well as subjective information and objective measures which is affecting overall participation. Patient will benefit from skilled physical therapy in order to improve function and reduce impairment.  OBJECTIVE IMPAIRMENTS: decreased activity tolerance, decreased mobility, difficulty walking, decreased ROM, decreased strength, improper body mechanics, and pain.   ACTIVITY LIMITATIONS: carrying, lifting, bending, squatting, stairs, transfers, locomotion level, and caring for others  PARTICIPATION LIMITATIONS: meal prep, cleaning, laundry, shopping, community activity, occupation, and yard work  PERSONAL FACTORS: Fitness and Time since onset of injury/illness/exacerbation are also affecting patient's functional outcome.   REHAB POTENTIAL: Good  CLINICAL DECISION MAKING: Stable/uncomplicated  EVALUATION COMPLEXITY: Low   GOALS: Goals reviewed with patient? Yes  SHORT TERM GOALS: Target date: 04/17/2023    Patient will be independent with HEP in order to improve functional outcomes. Baseline: Goal status:MET  2.  Patient will report at least 25% improvement in symptoms for improved quality of life. Baseline:60%  Goal  status:MET - 05/12/23   LONG TERM GOALS: Target date: 05/15/2023    Patient will report at least 75% improvement in symptoms for improved quality of life. Baseline: 85-90%  Goal status: MET  2.  Patient will improve FOTO score to predicted outcomes in order to indicate improved tolerance to activity. Baseline: 59% function Goal status: In progress - see above   3.  Patient will be able to navigate stairs with reciprocal pattern without compensation in order to demonstrate improved LE strength. Baseline:  Goal status: MET  4.  Patient will be able to perform forward step down test without deviation in order to demonstrate  improved LE strength and motor control.  Baseline:  Goal status:In progress   5.  Patient will be able to complete 5x STS in under 11.4 seconds in order to reduce the risk of falls. Baseline:  Goal status: MET  6.  Patient will demonstrate grade of 5/5 MMT grade in all tested musculature as evidence of improved strength to assist with stair ambulation and gait.   Baseline:  Goal status: MET   PLAN:  PT FREQUENCY: 1x/week  PT DURATION: 4 weeks  PLANNED INTERVENTIONS: 97164- PT Re-evaluation, 97110-Therapeutic exercises, 97530- Therapeutic activity, 97112- Neuromuscular re-education, 97535- Self Care, 21308- Manual therapy, [phone]- Gait training, [phone]- Orthotic Fit/training, [phone]- Canalith repositioning, [id number]- Aquatic Therapy, [phone]- Splinting, Patient/Family education, Balance training, Stair training, Taping, Dry Needling, Joint mobilization, Joint manipulation, Spinal manipulation, Spinal mobilization, Scar mobilization, and DME instructions.  PLAN FOR NEXT SESSION: quad and glute strength and motor control   Mayer Camel, PTA 05/26/23 4:27 PM [facility] [address] [place], [place], [address] Phone: [phone]   Fax:  [phone]

## 2023-05-28 ENCOUNTER — Other Ambulatory Visit: Payer: Self-pay | Admitting: *Deleted

## 2023-05-28 DIAGNOSIS — I8393 Asymptomatic varicose veins of bilateral lower extremities: Secondary | ICD-10-CM

## 2023-06-02 ENCOUNTER — Ambulatory Visit (HOSPITAL_BASED_OUTPATIENT_CLINIC_OR_DEPARTMENT_OTHER): Payer: Self-pay | Admitting: Physical Therapy

## 2023-06-02 ENCOUNTER — Encounter (HOSPITAL_BASED_OUTPATIENT_CLINIC_OR_DEPARTMENT_OTHER): Payer: Self-pay | Admitting: Physical Therapy

## 2023-06-02 ENCOUNTER — Ambulatory Visit (INDEPENDENT_AMBULATORY_CARE_PROVIDER_SITE_OTHER)
Admission: RE | Admit: 2023-06-02 | Discharge: 2023-06-02 | Disposition: A | Discharge status: Home / Self Care | Payer: 59 | Source: Ambulatory Visit | Attending: Vascular Surgery | Admitting: Vascular Surgery

## 2023-06-02 DIAGNOSIS — M6281 Muscle weakness (generalized): Secondary | ICD-10-CM

## 2023-06-02 DIAGNOSIS — M25562 Pain in left knee: Secondary | ICD-10-CM

## 2023-06-02 DIAGNOSIS — M25561 Pain in right knee: Secondary | ICD-10-CM | POA: Diagnosis not present

## 2023-06-02 DIAGNOSIS — I8393 Asymptomatic varicose veins of bilateral lower extremities: Secondary | ICD-10-CM | POA: Insufficient documentation

## 2023-06-02 DIAGNOSIS — R29898 Other symptoms and signs involving the musculoskeletal system: Secondary | ICD-10-CM

## 2023-06-02 NOTE — Therapy (Signed)
 OUTPATIENT PHYSICAL THERAPY LOWER EXTREMITY TREATMENT   Patient Name: Derek Cowan MRN: [id number] DOB:1970-10-29, 53 y.o., male Today's Date: 06/02/2023   END OF SESSION:  PT End of Session - 06/02/23 1536     Visit Number 8    Number of Visits 20    Date for PT Re-Evaluation 06/17/23    Authorization Type BC[employer]    PT Start Time 1532    PT Stop Time 1610    PT Time Calculation (min) 38 min    Behavior During Therapy [facility] for tasks assessed/performed               Past Medical History:  Diagnosis Date   Eczema    Impaired fasting blood sugar    Vitamin D deficiency    Past Surgical History:  Procedure Laterality Date   HAND SURGERY  2021   tumor of left hand, benign   Patient Active Problem List   Diagnosis Date Noted   Essential hypertension, benign 05/19/2023   Skin rash 05/19/2023   Decreased urine stream 05/19/2023   BPH with lower urinary tract symptoms without urinary obstruction 05/19/2023   Dermatitis 05/19/2023   Vitamin D deficiency 06/30/2022   Paresthesia 11/20/2020   Varicose veins of both lower extremities 11/20/2020   Skin tags, multiple acquired 11/20/2020   Hair loss 11/20/2020   Alkaline phosphatase elevation 03/02/2020   Impaired fasting blood sugar 03/02/2020   BMI 29.0-29.9,adult 03/02/2020   Encounter for health maintenance examination in adult 11/18/2019   Screening for lipid disorders 11/18/2019   Screening for prostate cancer 11/18/2019   Screen for colon cancer 11/18/2019   Screen for STD (sexually transmitted disease) 11/18/2019   Chronic nonintractable headache 09/28/2019   Snoring 09/28/2019   Ganglion cyst of finger of left hand 12/24/2018   Need for influenza vaccination 12/24/2018   Eczema 12/24/2018   Influenza vaccination declined 11/27/2017    PCP: Jac Canavan, PA-C  REFERRING PROVIDER: Brenton Grills, MD  REFERRING DIAG: [phone] (ICD-10-CM) - Chronic pain of right knee; Evaluate and treat for right  quadriceps tendon calcific tendinitis.  THERAPY DIAG:  Right knee pain, unspecified chronicity  Left knee pain, unspecified chronicity  Muscle weakness (generalized)  Other symptoms and signs involving the musculoskeletal system  Rationale for Evaluation and Treatment: Rehabilitation  ONSET DATE: Feb 2024  SUBJECTIVE:   SUBJECTIVE STATEMENT: Pt reports his pain has been more intermittent, instead of constant.  He continues to have pain if sitting too long and coming down the stairs.  Had vein [place] of LEs today; has follow up for results next week.     PERTINENT HISTORY: Hx knee pain PAIN:  Are you having pain? yes : NPRS scale: 1/10 Pain location: R medial knee Pain description: dull Aggravating factors: stairs, piano pedal, driving Relieving factors: brace  PRECAUTIONS: None  WEIGHT BEARING RESTRICTIONS: No  FALLS:  Has patient fallen in last 6 months? No  OCCUPATION: Education  PLOF: Independent  PATIENT GOALS: getting some relief/eliminate pain   OBJECTIVE: (objective measures from initial evaluation unless otherwise dated)   PATIENT SURVEYS:  FOTO 59% function   05/20/23 67% function  COGNITION: Overall cognitive status: Within functional limits for tasks assessed     SENSATION: WFL  POSTURE: No Significant postural limitations  PALPATION: TTP R medial femoral condyle; Patellar mobility WFL bilateral  LOWER EXTREMITY ROM:  Active ROM Right eval Left eval  Hip flexion    Hip extension    Hip abduction    Hip  adduction    Hip internal rotation    Hip external rotation    Knee flexion 135 137  Knee extension 0 0  Ankle dorsiflexion    Ankle plantarflexion    Ankle inversion    Ankle eversion     (Blank rows = not tested) *= pain/symptoms  LOWER EXTREMITY MMT:  MMT Right eval Left eval Right 05/20/23 Left 05/20/23  Hip flexion 5 5    Hip extension 4+ 5 5 5   Hip abduction 4+ 5 5 5   Hip adduction      Hip internal rotation      Hip  external rotation      Knee flexion 5 5    Knee extension 5 5    Ankle dorsiflexion 5 5    Ankle plantarflexion      Ankle inversion      Ankle eversion       (Blank rows = not tested) *= pain/symptoms    FUNCTIONAL TESTS:  5 times sit to stand: 12.42 seconds without UE use; R knee pain, slight decreased motor control Forward step down test: R knee valgus and impaired motor control, symptoms, decreased eccentric quad/glute strength  05/12/23: 5x STS:12.18s   GAIT: Distance walked:  100 feet Assistive device utilized: None Level of assistance: Complete Independence Comments: WFL  Reassessment 05/20/23: FUNCTIONAL TESTS:  5 times sit to stand: 7.42 seconds without UE use; R knee pain very slight Forward step down test:  improved mechanics, mild decrease in R quad/hip strength with decreased motor control, mild R knee symptoms Stair: 7 inch alternating, good mechanics.   TODAY'S TREATMENT:                                                                                                                              [facility] Adult PT Treatment:                                                DATE: 06/02/23  Therapeutic Exercise: Recumbent bike L7 x 5 min (no pain)  Standing quad stretch 2 x 15 sec each  Cybex leg press 85#,  2 x 10, 95# x 10 LF seated leg curl (hamstring) 40# x 10, 55# x 10 LF calf extension 55# x 10, 65# x 10   LF leg (knee) extension 25# x 10, 35# x 10 (reported fatigue, no pain) Sidelying hip abdct:  circles x 5; 10 reps with 3 pulses each.  Standing quad stretch x 15s x 2 each LE Supine piriformis stretch x 15s each LE  [facility] Adult PT Treatment:  DATE: 05/26/23  Therapeutic Exercise: Recumbent bike L9-6 x 7 min (no pain)  Standing quad stretch x 20 sec each  LF calf ext 25# x 10, 30# 2 x 10  LF leg (knee) ext 15# 3 x 10 (no pain) Cybex leg press 70#,  3 x 10 Standing quad stretch x 15s x 2 each LE Standing hamstring x  20s each LE Standing calf stretch x 20s   DATE:  05/20/23 Reassessment Education and discussion of POC Lateral step down 6 inch 2 x 10   [facility] Adult PT Treatment:                                                DATE: 05/12/23  Therapeutic Exercise: Upright bike L3: 5 min (mild R knee pain, 3/10) Standing quad stretch x 20 sec each  Recumbent bike L3 x 1.5 min (no pain)  LF leg press 35#,  2 x 10 LF knee ext 15# x 3 (R knee pain, 2/10) LF calf ext 10# x 10, 20# x 10  LF hip abdct (seated) 55# 3 x 10    04/30/23 Step up 6 inch 1 x 10 Lateral step down 4 inch 2 x 10  Squat 2 x 10 Lateral stepping 5 x 8 feet  04/23/23 -treadmill 2. x 5.5 min for warm up - standing quad stretch x 20s x 2 each LE - sidelying hip abdct, 2 x 10 RLE, 1 x 10 - fig 4 bridge, 5 sec hold x 10 each LE - long sitting SLR-> abdct/addct 3 x 5 ,good challenge for RLE - standing mini squat with opp leg slide into abdct/ ext x 5 each side  - quadruped (knees on pillow on mat table) with LE ext x 3 each side (mild increase in pain with pressure on knees)  04/16/23 -Standing quad stretch x 20s each  -Rock tape application to R ant knee  - forward step up/step down, retro step up/down x 8 RLE, x 5 LLE (no increase in pain) - sidelying R hip abdct x 10, clams x 10, hip abdct with 3 pulses x 10 (req rest after 7) - SLR x 10 slow - Fig 4 bridge with 5 sec hold in ext    PATIENT EDUCATION:  Education details: exercise rationale and technique   Person educated: Patient Education method: Programmer, multimedia, Demonstration,  Education comprehension: verbalized understanding, returned demonstration, verbal cues required, and tactile cues required  HOME EXERCISE PROGRAM: Access Code: [email or web address] URL: [email or web address]  ASSESSMENT:  CLINICAL IMPRESSION: Pt able to increase resistance on weight machines without discomfort in knees. Discussed self care following session. Pt progressing well towards  remaining goals.  atient will continue to benefit from skilled physical therapy in order to improve function and reduce impairment. Pt has 2 remaining visits scheduled.      From initial evaluation: Patient a 53 y.o. y.o. male who was seen today for physical therapy evaluation and treatment for bilateral knee pain. Patient presents with pain limited deficits in bilateral knee strength, ROM, endurance, activity tolerance, and functional mobility with ADL. Patient is having to modify and restrict ADL as indicated by outcome measure score as well as subjective information and objective measures which is affecting overall participation. Patient will benefit from skilled physical therapy in order to improve function and reduce impairment.  OBJECTIVE IMPAIRMENTS: decreased activity tolerance, decreased mobility,  difficulty walking, decreased ROM, decreased strength, improper body mechanics, and pain.   ACTIVITY LIMITATIONS: carrying, lifting, bending, squatting, stairs, transfers, locomotion level, and caring for others  PARTICIPATION LIMITATIONS: meal prep, cleaning, laundry, shopping, community activity, occupation, and yard work  PERSONAL FACTORS: Fitness and Time since onset of injury/illness/exacerbation are also affecting patient's functional outcome.   REHAB POTENTIAL: Good  CLINICAL DECISION MAKING: Stable/uncomplicated  EVALUATION COMPLEXITY: Low   GOALS: Goals reviewed with patient? Yes  SHORT TERM GOALS: Target date: 04/17/2023    Patient will be independent with HEP in order to improve functional outcomes. Baseline: Goal status:MET  2.  Patient will report at least 25% improvement in symptoms for improved quality of life. Baseline:60%  Goal status:MET - 05/12/23   LONG TERM GOALS: Target date: 05/15/2023    Patient will report at least 75% improvement in symptoms for improved quality of life. Baseline: 85-90%  Goal status: MET  2.  Patient will improve FOTO score to  predicted outcomes in order to indicate improved tolerance to activity. Baseline: 59% function Goal status: In progress - see above   3.  Patient will be able to navigate stairs with reciprocal pattern without compensation in order to demonstrate improved LE strength. Baseline:  Goal status: MET  4.  Patient will be able to perform forward step down test without deviation in order to demonstrate improved LE strength and motor control.  Baseline:  Goal status:In progress   5.  Patient will be able to complete 5x STS in under 11.4 seconds in order to reduce the risk of falls. Baseline:  Goal status: MET  6.  Patient will demonstrate grade of 5/5 MMT grade in all tested musculature as evidence of improved strength to assist with stair ambulation and gait.   Baseline:  Goal status: MET   PLAN:  PT FREQUENCY: 1x/week  PT DURATION: 4 weeks  PLANNED INTERVENTIONS: 97164- PT Re-evaluation, 97110-Therapeutic exercises, 97530- Therapeutic activity, 97112- Neuromuscular re-education, 97535- Self Care, 29562- Manual therapy, [phone]- Gait training, [phone]- Orthotic Fit/training, [phone]- Canalith repositioning, [id number]- Aquatic Therapy, [phone]- Splinting, Patient/Family education, Balance training, Stair training, Taping, Dry Needling, Joint mobilization, Joint manipulation, Spinal manipulation, Spinal mobilization, Scar mobilization, and DME instructions.  PLAN FOR NEXT SESSION: quad and glute strength and motor control   Mayer Camel, PTA 06/02/23 4:27 PM [facility] [address] [place], [place], [address] Phone: [phone]   Fax:  [phone]

## 2023-06-03 ENCOUNTER — Other Ambulatory Visit: Payer: Self-pay | Admitting: Medical

## 2023-06-03 ENCOUNTER — Ambulatory Visit: Payer: Self-pay | Admitting: Medical

## 2023-06-03 NOTE — Telephone Encounter (Signed)
 Copied from CRM [id number]. Topic: Clinical - Red Word Triage >> Jun 03, 2023  8:54 AM Geroge Baseman wrote: Red Word that prompted transfer to Nurse Triage: Feb 25th received a new med for blood pressure, doctor wanted him to follow up with how new meds are working. Blood pressure seems to be high now on new meds, having pains in chest off and on, and mostly at night. 166/109 today. 157/103 yesterday morning, 140/97 Monday morning, 153/106 Saturday morning. Concerned  Chief Complaint: Elevated BP Symptoms: None at this time Frequency: 1-2 weeks Pertinent Negatives: Patient denies symptoms at this time Disposition: [] ED /[] Urgent Care (no appt availability in office) / [x] Appointment(In office/virtual)/ []  [place] [facility]/ [] Home Care/ [] Refused Recommended Disposition /[] [place] [facility]/ []  Follow-up with PCP Additional Notes: Patient called in to report elevated BP following a recent BP medication change. Patient stated he started taking terazosin on 05/22/23, following and OV with his PCP. Patient reported that his BP was 166/103 this morning and 157/103 last night. Patient stated that he has been taking terazosin nightly and has not missed any doses. Patient is asymptomatic at this time. Patient did say he experiences occasional heart palpitations and pains, but stated he experienced this before switching BP medications. This RN advised patient to see PCP within 3 days, per protocol. Patient advised this RN that he is going out of town (within [facility]) this afternoon and will not be back until Friday afternoon. No availability in office that would work around travel. Patient stated he could do a virtual visit. This RN scheduled patient for a virtual visit with PCP on Thursday. This RN advised patient to pack automatic BP cuff and have it available for the appointment. Patient asked this RN if he should make any changes to this medication. This RN advised that he should wait for provider approval. This  RN advised patient to call back if symptoms worsen. Patient complied.   Reason for Disposition  Systolic BP  >= 160 OR Diastolic >= 100  Answer Assessment - Initial Assessment Questions 1. BLOOD PRESSURE: "What is the blood pressure?" "Did you take at least two measurements 5 minutes apart?"     157/103 yesterday night 2. ONSET: "When did you take your blood pressure?"     Last night, made medication change 2 weeks 3. HOW: "How did you take your blood pressure?" (e.g., automatic home BP monitor, visiting nurse)     Automatic cuff at home 4. HISTORY: "Do you have a history of high blood pressure?"     Yes, typically 130-140s 5. MEDICINES: "Are you taking any medicines for blood pressure?" "Have you missed any doses recently?"     Terazosin at night, has not missed any doses 6. OTHER SYMPTOMS: "Do you have any symptoms?" (e.g., blurred vision, chest pain, difficulty breathing, headache, weakness)     Denies symptoms at this times, states he has felt heart palpitations, brief heart pains that were present before medication change  Protocols used: Blood Pressure - High-A-AH

## 2023-06-03 NOTE — Telephone Encounter (Signed)
 Spoke to patient, he will start taking 2 tablets and will report back to [place]. He was scheduled for tomorrow for virtual but I have cancelled that.

## 2023-06-04 ENCOUNTER — Ambulatory Visit: Admitting: Medical

## 2023-06-10 ENCOUNTER — Ambulatory Visit: Payer: Self-pay | Admitting: Medical

## 2023-06-10 ENCOUNTER — Ambulatory Visit: Admitting: Family Medicine

## 2023-06-10 VITALS — BP 136/86 | HR 89 | Wt 191.4 lb

## 2023-06-10 DIAGNOSIS — I10 Essential (primary) hypertension: Secondary | ICD-10-CM

## 2023-06-10 MED ORDER — TERAZOSIN HCL 2 MG PO CAPS: 2.0000 mg | ORAL_CAPSULE | Freq: Every day | ORAL | 1 refills | Status: DC

## 2023-06-10 NOTE — Progress Notes (Signed)
   Subjective:    Patient ID: Derek Cowan, male    DOB: 16-Feb-1971, 53 y.o.   MRN: [id number]  HPI He is he is here for evaluation of continued issues with his blood pressure.  He has gotten a cuff at home and has been checking it.  He brought it in here for verification.  He has noted a few times of it is being elevated and did go to the urgent care to have this looked at.  He is now taking 2 mg of Hytrin per day.   Review of Systems     Objective:    Physical Exam Alert and in no distress.  Blood pressure is recorded.       Assessment & Plan:  Hypertension, unspecified type - Plan: terazosin (HYTRIN) 2 MG capsule His machine at home is roughly 8 mm high systolic and 10 diastolic.  I recommend that he adjust his readings based on this.  Discussed tracking his blood pressure in the resting sitting position arm at heart level.  Discussed the fact that we want to treat blood pressure to the best long-term damage from blood pressure in terms of stroke, heart failure, kidney failure.  Explained the fact that headache and blood pressure are not really associated with each other.  He is scheduled to follow-up with Kristian Covey soon.

## 2023-06-10 NOTE — Telephone Encounter (Signed)
  Chief Complaint: recent increase in BP medication- does not seem to be bringing BP down Symptoms: Patient doubled his BP medication dosage per provider advice- it is not helping to bring number down Frequency: 06/03/23  Disposition: [] ED /[] Urgent Care (no appt availability in office) / [x] Appointment(In office/virtual)/ []  [facility] [facility]/ [] Home Care/ [] Refused Recommended Disposition /[] [place] [facility]/ []  Follow-up with PCP Additional Notes: Patient has been scheduled for appointment to assess his BP- may need medication review. He will also bring BP machine with him.    Copied from CRM [id number]. Topic: Clinical - Red Word Triage >> Jun 10, 2023  9:19 AM Elle L wrote: Red Word that prompted transfer to Nurse Triage: The patient states that he was advised to take his terazosin (HYTRIN) 1 MG capsule twice a day instead of once a day and he is running out of the medication. However, he advised that is blood pressure is continuing to rise with the higher dosage as it is 147/104. Reason for Disposition  [1] Caller has URGENT medicine question about med that PCP or specialist prescribed AND [2] triager unable to answer question  Answer Assessment - Initial Assessment Questions 1. NAME of MEDICINE: "What medicine(s) are you calling about?"     terazosin (HYTRIN) 1 MG capsule twice a day  2. QUESTION: "What is your question?" (e.g., double dose of medicine, side effect)     Patient states the BP medication is not lowering BP 3. PRESCRIBER: "Who prescribed the medicine?" Reason: if prescribed by specialist, call should be referred to that group.     PCP 4. SYMPTOMS: "Do you have any symptoms?" If Yes, ask: "What symptoms are you having?"  "How bad are the symptoms (e.g., mild, moderate, severe)  Thursday-  132/93  Friday- 131/86  Sunday- 126/93  Monday -147/104, Tuesday -131/99,  Wednesday- (did not sleep )143/101,147/104   P 100  Protocols used: Medication Question  Call-A-AH

## 2023-06-11 ENCOUNTER — Encounter (HOSPITAL_BASED_OUTPATIENT_CLINIC_OR_DEPARTMENT_OTHER): Payer: Self-pay | Admitting: Physical Therapy

## 2023-06-11 ENCOUNTER — Ambulatory Visit (HOSPITAL_BASED_OUTPATIENT_CLINIC_OR_DEPARTMENT_OTHER): Payer: 59 | Admitting: Physical Therapy

## 2023-06-11 DIAGNOSIS — M25561 Pain in right knee: Secondary | ICD-10-CM | POA: Diagnosis not present

## 2023-06-11 DIAGNOSIS — R29898 Other symptoms and signs involving the musculoskeletal system: Secondary | ICD-10-CM

## 2023-06-11 DIAGNOSIS — R2689 Other abnormalities of gait and mobility: Secondary | ICD-10-CM

## 2023-06-11 DIAGNOSIS — M25562 Pain in left knee: Secondary | ICD-10-CM

## 2023-06-11 DIAGNOSIS — M6281 Muscle weakness (generalized): Secondary | ICD-10-CM

## 2023-06-11 NOTE — Therapy (Signed)
 OUTPATIENT PHYSICAL THERAPY LOWER EXTREMITY TREATMENT   Patient Name: Derek Cowan MRN: [id number] DOB:[birth year], 53 y.o., male Today's Date: 06/11/2023   END OF SESSION:  PT End of Session - 06/11/23 1308     Visit Number 9    Number of Visits 20    Date for PT Re-Evaluation 06/17/23    Authorization Type BC[employer]    PT Start Time 1307    PT Stop Time 1345    PT Time Calculation (min) 38 min    Behavior During Therapy [facility] for tasks assessed/performed               Past Medical History:  Diagnosis Date   Eczema    Impaired fasting blood sugar    Vitamin D deficiency    Past Surgical History:  Procedure Laterality Date   HAND SURGERY  2021   tumor of left hand, benign   Patient Active Problem List   Diagnosis Date Noted   Essential hypertension, benign 05/19/2023   Skin rash 05/19/2023   Decreased urine stream 05/19/2023   BPH with lower urinary tract symptoms without urinary obstruction 05/19/2023   Dermatitis 05/19/2023   Vitamin D deficiency 06/30/2022   Paresthesia 11/20/2020   Varicose veins of both lower extremities 11/20/2020   Skin tags, multiple acquired 11/20/2020   Hair loss 11/20/2020   Alkaline phosphatase elevation 03/02/2020   Impaired fasting blood sugar 03/02/2020   BMI 29.0-29.9,adult 03/02/2020   Encounter for health maintenance examination in adult 11/18/2019   Screening for lipid disorders 11/18/2019   Screening for prostate cancer 11/18/2019   Screen for colon cancer 11/18/2019   Screen for STD (sexually transmitted disease) 11/18/2019   Chronic nonintractable headache 09/28/2019   Snoring 09/28/2019   Ganglion cyst of finger of left hand 12/24/2018   Need for influenza vaccination 12/24/2018   Eczema 12/24/2018   Influenza vaccination declined 11/27/2017    PCP: Jac Canavan, PA-C  REFERRING PROVIDER: Brenton Grills, MD  REFERRING DIAG: [phone] (ICD-10-CM) - Chronic pain of right knee; Evaluate and treat for right  quadriceps tendon calcific tendinitis.  THERAPY DIAG:  Right knee pain, unspecified chronicity  Left knee pain, unspecified chronicity  Muscle weakness (generalized)  Other symptoms and signs involving the musculoskeletal system  Other abnormalities of gait and mobility  Rationale for Evaluation and Treatment: Rehabilitation  ONSET DATE: Feb 2024  SUBJECTIVE:   SUBJECTIVE STATEMENT: Pt reports knee pain with playing the keyboard due to knee positioning with petal.    PERTINENT HISTORY: Hx knee pain PAIN:  Are you having pain? yes : NPRS scale: 1/10 Pain location: R medial knee Pain description: dull Aggravating factors: stairs, piano pedal, driving Relieving factors: brace  PRECAUTIONS: None  WEIGHT BEARING RESTRICTIONS: No  FALLS:  Has patient fallen in last 6 months? No  OCCUPATION: Education  PLOF: Independent  PATIENT GOALS: getting some relief/eliminate pain   OBJECTIVE: (objective measures from initial evaluation unless otherwise dated)   PATIENT SURVEYS:  FOTO 59% function   05/20/23 67% function  COGNITION: Overall cognitive status: Within functional limits for tasks assessed     SENSATION: WFL  POSTURE: No Significant postural limitations  PALPATION: TTP R medial femoral condyle; Patellar mobility WFL bilateral  LOWER EXTREMITY ROM:  Active ROM Right eval Left eval  Hip flexion    Hip extension    Hip abduction    Hip adduction    Hip internal rotation    Hip external rotation    Knee flexion 135 137  Knee extension 0 0  Ankle dorsiflexion    Ankle plantarflexion    Ankle inversion    Ankle eversion     (Blank rows = not tested) *= pain/symptoms  LOWER EXTREMITY MMT:  MMT Right eval Left eval Right 05/20/23 Left 05/20/23  Hip flexion 5 5    Hip extension 4+ 5 5 5   Hip abduction 4+ 5 5 5   Hip adduction      Hip internal rotation      Hip external rotation      Knee flexion 5 5    Knee extension 5 5    Ankle  dorsiflexion 5 5    Ankle plantarflexion      Ankle inversion      Ankle eversion       (Blank rows = not tested) *= pain/symptoms    FUNCTIONAL TESTS:  5 times sit to stand: 12.42 seconds without UE use; R knee pain, slight decreased motor control Forward step down test: R knee valgus and impaired motor control, symptoms, decreased eccentric quad/glute strength  05/12/23: 5x STS:12.18s   GAIT: Distance walked:  100 feet Assistive device utilized: None Level of assistance: Complete Independence Comments: WFL  Reassessment 05/20/23: FUNCTIONAL TESTS:  5 times sit to stand: 7.42 seconds without UE use; R knee pain very slight Forward step down test:  improved mechanics, mild decrease in R quad/hip strength with decreased motor control, mild R knee symptoms Stair: 7 inch alternating, good mechanics.   TODAY'S TREATMENT:                                                                                                                              06/11/23 Discussion of LE positioning with piano and ways to improve, discussion of POC Elliptical 5 minutes for dynamic warm up Knee extension machine 45# 3 x 10 Cybex leg press 95# 3 x 10 LF leg press 95# 2 x 10  LF seated leg curl (hamstring) 55# 2 x 10   [facility] Adult PT Treatment:                                                DATE: 06/02/23  Therapeutic Exercise: Recumbent bike L7 x 5 min (no pain)  Standing quad stretch 2 x 15 sec each  Cybex leg press 85#,  2 x 10, 95# x 10 LF seated leg curl (hamstring) 40# x 10, 55# x 10 LF calf extension 55# x 10, 65# x 10   LF leg (knee) extension 25# x 10, 35# x 10 (reported fatigue, no pain) Sidelying hip abdct:  circles x 5; 10 reps with 3 pulses each.  Standing quad stretch x 15s x 2 each LE Supine piriformis stretch x 15s each LE  [facility] Adult PT Treatment:  DATE: 05/26/23  Therapeutic Exercise: Recumbent bike L9-6 x 7 min (no pain)  Standing  quad stretch x 20 sec each  LF calf ext 25# x 10, 30# 2 x 10  LF leg (knee) ext 15# 3 x 10 (no pain) Cybex leg press 70#,  3 x 10 Standing quad stretch x 15s x 2 each LE Standing hamstring x 20s each LE Standing calf stretch x 20s   DATE:  05/20/23 Reassessment Education and discussion of POC Lateral step down 6 inch 2 x 10   [facility] Adult PT Treatment:                                                DATE: 05/12/23  Therapeutic Exercise: Upright bike L3: 5 min (mild R knee pain, 3/10) Standing quad stretch x 20 sec each  Recumbent bike L3 x 1.5 min (no pain)  LF leg press 35#,  2 x 10 LF knee ext 15# x 3 (R knee pain, 2/10) LF calf ext 10# x 10, 20# x 10  LF hip abdct (seated) 55# 3 x 10    04/30/23 Step up 6 inch 1 x 10 Lateral step down 4 inch 2 x 10  Squat 2 x 10 Lateral stepping 5 x 8 feet  04/23/23 -treadmill 2. x 5.5 min for warm up - standing quad stretch x 20s x 2 each LE - sidelying hip abdct, 2 x 10 RLE, 1 x 10 - fig 4 bridge, 5 sec hold x 10 each LE - long sitting SLR-> abdct/addct 3 x 5 ,good challenge for RLE - standing mini squat with opp leg slide into abdct/ ext x 5 each side  - quadruped (knees on pillow on mat table) with LE ext x 3 each side (mild increase in pain with pressure on knees)  04/16/23 -Standing quad stretch x 20s each  -Rock tape application to R ant knee  - forward step up/step down, retro step up/down x 8 RLE, x 5 LLE (no increase in pain) - sidelying R hip abdct x 10, clams x 10, hip abdct with 3 pulses x 10 (req rest after 7) - SLR x 10 slow - Fig 4 bridge with 5 sec hold in ext    PATIENT EDUCATION:  Education details: exercise rationale and technique   Person educated: Patient Education method: Programmer, multimedia, Demonstration,  Education comprehension: verbalized understanding, returned demonstration, verbal cues required, and tactile cues required  HOME EXERCISE PROGRAM: Access Code: [email or web address] URL:  [email or web address]  ASSESSMENT:  CLINICAL IMPRESSION: Discussed knee positioning with piano playing and awkward LE alignment likely contributing to symptoms. Discussed ways to stabilize piano sustain pedal to improve alignment and positioning. Continued with LE strengthening. Patient will continue to benefit from physical therapy in order to improve function and reduce impairment.     From initial evaluation: Patient a 53 y.o. y.o. male who was seen today for physical therapy evaluation and treatment for bilateral knee pain. Patient presents with pain limited deficits in bilateral knee strength, ROM, endurance, activity tolerance, and functional mobility with ADL. Patient is having to modify and restrict ADL as indicated by outcome measure score as well as subjective information and objective measures which is affecting overall participation. Patient will benefit from skilled physical therapy in order to improve function and reduce impairment.  OBJECTIVE IMPAIRMENTS: decreased activity tolerance, decreased mobility, difficulty walking,  decreased ROM, decreased strength, improper body mechanics, and pain.   ACTIVITY LIMITATIONS: carrying, lifting, bending, squatting, stairs, transfers, locomotion level, and caring for others  PARTICIPATION LIMITATIONS: meal prep, cleaning, laundry, shopping, community activity, occupation, and yard work  PERSONAL FACTORS: Fitness and Time since onset of injury/illness/exacerbation are also affecting patient's functional outcome.   REHAB POTENTIAL: Good  CLINICAL DECISION MAKING: Stable/uncomplicated  EVALUATION COMPLEXITY: Low   GOALS: Goals reviewed with patient? Yes  SHORT TERM GOALS: Target date: 04/17/2023    Patient will be independent with HEP in order to improve functional outcomes. Baseline: Goal status:MET  2.  Patient will report at least 25% improvement in symptoms for improved quality of life. Baseline:60%  Goal  status:MET - 05/12/23   LONG TERM GOALS: Target date: 05/15/2023    Patient will report at least 75% improvement in symptoms for improved quality of life. Baseline: 85-90%  Goal status: MET  2.  Patient will improve FOTO score to predicted outcomes in order to indicate improved tolerance to activity. Baseline: 59% function Goal status: In progress - see above   3.  Patient will be able to navigate stairs with reciprocal pattern without compensation in order to demonstrate improved LE strength. Baseline:  Goal status: MET  4.  Patient will be able to perform forward step down test without deviation in order to demonstrate improved LE strength and motor control.  Baseline:  Goal status:In progress   5.  Patient will be able to complete 5x STS in under 11.4 seconds in order to reduce the risk of falls. Baseline:  Goal status: MET  6.  Patient will demonstrate grade of 5/5 MMT grade in all tested musculature as evidence of improved strength to assist with stair ambulation and gait.   Baseline:  Goal status: MET   PLAN:  PT FREQUENCY: 1x/week  PT DURATION: 4 weeks  PLANNED INTERVENTIONS: 97164- PT Re-evaluation, 97110-Therapeutic exercises, 97530- Therapeutic activity, 97112- Neuromuscular re-education, 97535- Self Care, 16109- Manual therapy, [phone]- Gait training, [phone]- Orthotic Fit/training, [phone]- Canalith repositioning, [id number]- Aquatic Therapy, [phone]- Splinting, Patient/Family education, Balance training, Stair training, Taping, Dry Needling, Joint mobilization, Joint manipulation, Spinal manipulation, Spinal mobilization, Scar mobilization, and DME instructions.  PLAN FOR NEXT SESSION: quad and glute strength and motor control    Wyman Songster, PT 06/11/2023, 1:10 PM  [facility] [address] [place], [place], [address] Phone: [phone]   Fax:  [phone]

## 2023-06-15 ENCOUNTER — Encounter: Payer: Self-pay | Admitting: Physician Assistant

## 2023-06-15 ENCOUNTER — Ambulatory Visit (INDEPENDENT_AMBULATORY_CARE_PROVIDER_SITE_OTHER): Payer: 59 | Admitting: Physician Assistant

## 2023-06-15 VITALS — BP 133/90 | HR 85 | Temp 98.1°F | Ht 69.0 in | Wt 194.3 lb

## 2023-06-15 DIAGNOSIS — M7989 Other specified soft tissue disorders: Secondary | ICD-10-CM

## 2023-06-15 DIAGNOSIS — I872 Venous insufficiency (chronic) (peripheral): Secondary | ICD-10-CM

## 2023-06-15 NOTE — Progress Notes (Signed)
 Requested by:  Jac Canavan, PA-C [address] [place],  [place] [address]  Reason for consultation: rashes    History of Present Illness   Derek Cowan is a 53 y.o. (12/16/1970) male who presents for evaluation of venous insufficiency and possible stasis dermatitis. He states last year he randomly started dealing with nighttime sensations of "rubber bands around his legs".  This was not accompanied by any leg swelling, skin changes, or injury.  He ultimately went to physical therapy and these symptoms resolved after a couple of months.  Now over the past several months he has been dealing with new onset rashes in bilateral lower extremities, right greater than left.  He says that random patches of skin on his legs, both upper and lower legs, will break out into a itchy, scaly, dark, painful rash.  He describes the pain as burning.  With time, these rashes will go away on their own and then other ones will pop up in different places.  Some of the rashes once healed have left hyperpigmented patches of skin.  He has been seen by a dermatologist and given some topical creams to try, however none of them have helped with his symptoms.  He was told he should be seen by a vascular specialist for possible stasis dermatitis evaluation.  He denies any issues with lower extremity swelling.   He has no prior history of DVT.  He has no prior history of vein procedures in either leg.  He stays very active and walks around 2 miles a day.  He says recently he started wearing knee-high compression stockings that he bought off [employer].  He does not elevate his legs.  Past Medical History:  Diagnosis Date   Eczema    Impaired fasting blood sugar    Vitamin D deficiency     Past Surgical History:  Procedure Laterality Date   HAND SURGERY  2021   tumor of left hand, benign    Social History   Socioeconomic History   Marital status: Single    Spouse name: Not on file   Number of children:  Not on file   Years of education: Not on file   Highest education level: Not on file  Occupational History   Not on file  Tobacco Use   Smoking status: Never   Smokeless tobacco: Never  Vaping Use   Vaping status: Never Used  Substance and Sexual Activity   Alcohol use: Not Currently    Comment: occasional   Drug use: Never   Sexual activity: Not on file  Other Topics Concern   Not on file  Social History Narrative   Single, no children.  Exercise - walking daily.  Works for [employer].   06/2022   Social Drivers of Corporate investment banker Strain: Not on file  Food Insecurity: Not on file  Transportation Needs: Not on file  Physical Activity: Not on file  Stress: Not on file  Social Connections: Not on file  Intimate Partner Violence: Not on file    Family History  Problem Relation Age of Onset   Lupus Mother    Lung disease Mother        ARDS   Hypertension Mother    Diabetes Father    Prostatitis Father    Arrhythmia Sister    Miscarriages / Stillbirths Sister    Hypertension Sister    Heart disease Paternal Aunt    Cancer Paternal Grandfather  lung   Stroke Neg Hx     Current Outpatient Medications  Medication Sig Dispense Refill   cholecalciferol (VITAMIN D3) 25 MCG (1000 UNIT) tablet Take 1 tablet (1,000 Units total) by mouth daily. 90 tablet 3   clobetasol ointment (TEMOVATE) 0.05 % Apply 1 Application topically 2 (two) times daily. 45 g 0   omeprazole (PRILOSEC) 40 MG capsule Take 1 capsule (40 mg total) by mouth daily. 30 capsule 0   terazosin (HYTRIN) 2 MG capsule Take 1 capsule (2 mg total) by mouth at bedtime. 90 capsule 1   betamethasone valerate ointment (VALISONE) 0.1 % Apply 1 Application topically 2 (two) times daily. (Patient not taking: Reported on 06/15/2023) 45 g 0   Crisaborole (EUCRISA) 2 % OINT APPLY TOPICALLY ONCE DAILY (Patient not taking: Reported on 06/15/2023)     fluocinonide cream (LIDEX) 0.05 % Apply  topically. (Patient not taking: Reported on 06/15/2023)     losartan (COZAAR) 25 MG tablet Take 25 mg by mouth daily. (Patient not taking: Reported on 06/15/2023)     No current facility-administered medications for this visit.    Allergies  Allergen Reactions   Lactose Intolerance (Gi)     REVIEW OF SYSTEMS (negative unless checked):   Cardiac:  []  Chest pain or chest pressure? []  Shortness of breath upon activity? []  Shortness of breath when lying flat? []  Irregular heart rhythm?  Vascular:  []  Pain in calf, thigh, or hip brought on by walking? []  Pain in feet at night that wakes you up from your sleep? []  Blood clot in your veins? []  Leg swelling?  Pulmonary:  []  Oxygen at home? []  Productive cough? []  Wheezing?  Neurologic:  []  Sudden weakness in arms or legs? []  Sudden numbness in arms or legs? []  Sudden onset of difficult speaking or slurred speech? []  Temporary loss of vision in one eye? []  Problems with dizziness?  Gastrointestinal:  []  Blood in stool? []  Vomited blood?  Genitourinary:  []  Burning when urinating? []  Blood in urine?  Psychiatric:  []  Major depression  Hematologic:  []  Bleeding problems? []  Problems with blood clotting?  Dermatologic:  [x]  Rashes or ulcers?  Constitutional:  []  Fever or chills?  Ear/Nose/Throat:  []  Change in hearing? []  Nose bleeds? []  Sore throat?  Musculoskeletal:  []  Back pain? []  Joint pain? []  Muscle pain?   Physical Examination     Vitals:   06/15/23 1001  BP: (!) 133/90  Pulse: 85  Temp: 98.1 F (36.7 C)  TempSrc: Temporal  SpO2: 98%  Weight: 194 lb 4.8 oz (88.1 kg)  Height: 5\' 9"  (1.753 m)   Body mass index is 28.69 kg/m.  General:  WDWN in NAD; vital signs documented above Gait: Not observed HENT: WNL, normocephalic Pulmonary: normal non-labored breathing , without Rales, rhonchi,  wheezing Cardiac: regular Abdomen: soft, NT, no masses Skin: without rashes Vascular Exam/Pulses:   Extremities: 1-2 small varicose veins on BLE.  No edema of the legs or feet.  No active ulcerations or rashes.  Small area of hyperpigmented skin on the right distal, lateral thigh about 1 cm in diameter.  2 patches of hyperpigmented skin on the right anterolateral shin without rash or warmth, as pictured below Musculoskeletal: no muscle wasting or atrophy  Neurologic: A&O X 3;  No focal weakness or paresthesias are detected Psychiatric:  The pt has Normal affect.    Non-invasive Vascular Imaging   RLE Venous Insufficiency Duplex (06/02/2023):  RIGHT  Reflux NoRefluxReflux TimeDiameter cmsComments                          Yes                                   +--------------+---------+------+-----------+------------+--------+  CFV                    yes   >1 second                       +--------------+---------+------+-----------+------------+--------+  FV prox       no                                              +--------------+---------+------+-----------+------------+--------+  FV mid        no                                              +--------------+---------+------+-----------+------------+--------+  FV dist                 yes   >1 second                       +--------------+---------+------+-----------+------------+--------+  Popliteal    no                                              +--------------+---------+------+-----------+------------+--------+  GSV at [facility]    no                           0.769              +--------------+---------+------+-----------+------------+--------+  GSV prox thighno                           0.404              +--------------+---------+------+-----------+------------+--------+  GSV mid thigh no                           0.366              +--------------+---------+------+-----------+------------+--------+  GSV dist thighno                           0.419               +--------------+---------+------+-----------+------------+--------+  GSV at knee             yes    >500 ms     0.409              +--------------+---------+------+-----------+------------+--------+  GSV prox calf no                           0.302              +--------------+---------+------+-----------+------------+--------+  SSV Pop Fossa no                           0.421              +--------------+---------+------+-----------+------------+--------+  SSV prox calf           yes    >500 ms     0.279              +--------------+---------+------+-----------+------------+--------+  SSV mid calf            yes    >500 ms     0.265                Medical Decision Making   Derek Cowan is a 53 y.o. male who presents for evaluation of venous insufficiency  Based on the patient's duplex, there is reflux in the right common femoral vein, distal femoral vein, greater saphenous vein at the knee, and small saphenous vein in the proximal and mid calf.  The rest of the deep and superficial venous system is competent.  There is no evidence of DVT on duplex.  He would not be a candidate for saphenous vein ablation, given that his GSV is entirely competent throughout the thigh.  His small saphenous vein is also too small for ablation The patient describes a several month history of sudden onset rashes in bilateral lower extremities.  These rashes are hyperpigmented, itchy, scaly, and painful for the patient.  The rashes will come and go and self resolve.  They occur in his upper and lower legs, however more frequently on the right. He has undergone dermatology evaluation.  He says that he is attempted to use the topical creams provided to him, however this did not help with his rashes On exam he does have 1 small area of hyperpigmentation in the right distal lateral thigh.  He also has 2 patches of hyperpigmented skin in the right lower leg.  He states that  these are areas where there were previously rashes.  He has no lower extremity edema I have explained to the patient that potentially his rashes are stasis dermatitis, due to his underlying venous insufficiency.  Given that he is not a candidate for ablation procedure, the best way to treat his symptoms would be conservative therapy.  I have encouraged him to wear compression stockings daily, elevate his legs at least one time daily above his heart, and avoid prolonged sitting and standing.  He was measured for and given a pair of knee-high 20 to 30 mmHg compression stockings I have also explained to the patient that his rashes may not be stasis dermatitis, especially if they do not respond/improve with proper compression and leg elevation.  He may ultimately benefit from repeat dermatological evaluation if his symptoms do not improve He can follow-up with our office as needed  Ernestene Mention, PA-C [facility] Office: [phone]  06/15/2023, 10:01 AM  Clinic MD: Myra Gianotti

## 2023-06-20 ENCOUNTER — Ambulatory Visit (HOSPITAL_BASED_OUTPATIENT_CLINIC_OR_DEPARTMENT_OTHER): Payer: 59 | Admitting: Physical Therapy

## 2023-06-20 ENCOUNTER — Encounter (HOSPITAL_BASED_OUTPATIENT_CLINIC_OR_DEPARTMENT_OTHER): Payer: Self-pay | Admitting: Physical Therapy

## 2023-06-20 DIAGNOSIS — R29898 Other symptoms and signs involving the musculoskeletal system: Secondary | ICD-10-CM

## 2023-06-20 DIAGNOSIS — M25561 Pain in right knee: Secondary | ICD-10-CM

## 2023-06-20 DIAGNOSIS — R2689 Other abnormalities of gait and mobility: Secondary | ICD-10-CM

## 2023-06-20 DIAGNOSIS — M6281 Muscle weakness (generalized): Secondary | ICD-10-CM

## 2023-06-20 DIAGNOSIS — M25562 Pain in left knee: Secondary | ICD-10-CM

## 2023-06-20 NOTE — Therapy (Signed)
 OUTPATIENT PHYSICAL THERAPY LOWER EXTREMITY TREATMENT   Patient Name: Derek Cowan MRN: [id number] DOB:04-04-1970, 53 y.o., male Today's Date: 06/20/2023   PHYSICAL THERAPY DISCHARGE SUMMARY  Visits from Start of Care: 10  Current functional level related to goals / functional outcomes: See below   Remaining deficits: See below   Education / Equipment: See below   Patient agrees to discharge. Patient goals were met. Patient is being discharged due to meeting the stated rehab goals.   END OF SESSION:  PT End of Session - 06/20/23 1144     Visit Number 10    Number of Visits 20    Date for PT Re-Evaluation 06/17/23    Authorization Type [employer]    PT Start Time 1143   arrives late   PT Stop Time 1207    PT Time Calculation (min) 24 min    Activity Tolerance Patient tolerated treatment well    Behavior During Therapy [facility] for tasks assessed/performed               Past Medical History:  Diagnosis Date   Eczema    Impaired fasting blood sugar    Vitamin D deficiency    Past Surgical History:  Procedure Laterality Date   HAND SURGERY  2021   tumor of left hand, benign   Patient Active Problem List   Diagnosis Date Noted   Essential hypertension, benign 05/19/2023   Skin rash 05/19/2023   Decreased urine stream 05/19/2023   BPH with lower urinary tract symptoms without urinary obstruction 05/19/2023   Dermatitis 05/19/2023   Vitamin D deficiency 06/30/2022   Paresthesia 11/20/2020   Varicose veins of both lower extremities 11/20/2020   Skin tags, multiple acquired 11/20/2020   Hair loss 11/20/2020   Alkaline phosphatase elevation 03/02/2020   Impaired fasting blood sugar 03/02/2020   BMI 29.0-29.9,adult 03/02/2020   Encounter for health maintenance examination in adult 11/18/2019   Screening for lipid disorders 11/18/2019   Screening for prostate cancer 11/18/2019   Screen for colon cancer 11/18/2019   Screen for STD (sexually transmitted disease)  11/18/2019   Chronic nonintractable headache 09/28/2019   Snoring 09/28/2019   Ganglion cyst of finger of left hand 12/24/2018   Need for influenza vaccination 12/24/2018   Eczema 12/24/2018   Influenza vaccination declined 11/27/2017    PCP: Jac Canavan, PA-C  REFERRING PROVIDER: Brenton Grills, MD  REFERRING DIAG: [phone] (ICD-10-CM) - Chronic pain of right knee; Evaluate and treat for right quadriceps tendon calcific tendinitis.  THERAPY DIAG:  Right knee pain, unspecified chronicity  Left knee pain, unspecified chronicity  Muscle weakness (generalized)  Other symptoms and signs involving the musculoskeletal system  Other abnormalities of gait and mobility  Rationale for Evaluation and Treatment: Rehabilitation  ONSET DATE: Feb 2024  SUBJECTIVE:   SUBJECTIVE STATEMENT: Pt reports legs are sore from propping legs up at night as he has venous insufficiency. Ready to transition to HEP. Will continue to do exercises. Tried to stabilize his petal better.  Feels about 75-80% improvement in symptoms.   PERTINENT HISTORY: Hx knee pain PAIN:  Are you having pain? yes : NPRS scale: 1/10 Pain location: R medial knee Pain description: dull Aggravating factors: stairs, piano pedal, driving Relieving factors: brace  PRECAUTIONS: None  WEIGHT BEARING RESTRICTIONS: No  FALLS:  Has patient fallen in last 6 months? No  OCCUPATION: Education  PLOF: Independent  PATIENT GOALS: getting some relief/eliminate pain   OBJECTIVE: (objective measures from initial evaluation unless otherwise dated)  PATIENT SURVEYS:  FOTO 59% function   05/20/23 67% function 06/20/23: 70% function  COGNITION: Overall cognitive status: Within functional limits for tasks assessed     SENSATION: WFL  POSTURE: No Significant postural limitations  PALPATION: TTP R medial femoral condyle; Patellar mobility WFL bilateral  LOWER EXTREMITY ROM:  Active ROM Right eval Left eval   Hip flexion    Hip extension    Hip abduction    Hip adduction    Hip internal rotation    Hip external rotation    Knee flexion 135 137  Knee extension 0 0  Ankle dorsiflexion    Ankle plantarflexion    Ankle inversion    Ankle eversion     (Blank rows = not tested) *= pain/symptoms  LOWER EXTREMITY MMT:  MMT Right eval Left eval Right 05/20/23 Left 05/20/23  Hip flexion 5 5    Hip extension 4+ 5 5 5   Hip abduction 4+ 5 5 5   Hip adduction      Hip internal rotation      Hip external rotation      Knee flexion 5 5    Knee extension 5 5    Ankle dorsiflexion 5 5    Ankle plantarflexion      Ankle inversion      Ankle eversion       (Blank rows = not tested) *= pain/symptoms    FUNCTIONAL TESTS:  5 times sit to stand: 12.42 seconds without UE use; R knee pain, slight decreased motor control Forward step down test: R knee valgus and impaired motor control, symptoms, decreased eccentric quad/glute strength  05/12/23: 5x STS:12.18s   GAIT: Distance walked:  100 feet Assistive device utilized: None Level of assistance: Complete Independence Comments: WFL  Reassessment 05/20/23: FUNCTIONAL TESTS:  5 times sit to stand: 7.42 seconds without UE use; R knee pain very slight Forward step down test:  improved mechanics, mild decrease in R quad/hip strength with decreased motor control, mild R knee symptoms Stair: 7 inch alternating, good mechanics.   Reassessment 06/20/23: FUNCTIONAL TESTS:  Forward step down test:  improved mechanics and motor control, no knee pain Stair: 7 inch alternating, good mechanics.   TODAY'S TREATMENT:                                                                                                                              06/20/23 Reassessment and education  06/11/23 Discussion of LE positioning with piano and ways to improve, discussion of POC Elliptical 5 minutes for dynamic warm up Knee extension machine 45# 3 x 10 Cybex leg press 95#  3 x 10 LF leg press 95# 2 x 10  LF seated leg curl (hamstring) 55# 2 x 10   [facility] Adult PT Treatment:  DATE: 06/02/23  Therapeutic Exercise: Recumbent bike L7 x 5 min (no pain)  Standing quad stretch 2 x 15 sec each  Cybex leg press 85#,  2 x 10, 95# x 10 LF seated leg curl (hamstring) 40# x 10, 55# x 10 LF calf extension 55# x 10, 65# x 10   LF leg (knee) extension 25# x 10, 35# x 10 (reported fatigue, no pain) Sidelying hip abdct:  circles x 5; 10 reps with 3 pulses each.  Standing quad stretch x 15s x 2 each LE Supine piriformis stretch x 15s each LE  [facility] Adult PT Treatment:                                                DATE: 05/26/23  Therapeutic Exercise: Recumbent bike L9-6 x 7 min (no pain)  Standing quad stretch x 20 sec each  LF calf ext 25# x 10, 30# 2 x 10  LF leg (knee) ext 15# 3 x 10 (no pain) Cybex leg press 70#,  3 x 10 Standing quad stretch x 15s x 2 each LE Standing hamstring x 20s each LE Standing calf stretch x 20s   DATE:  05/20/23 Reassessment Education and discussion of POC Lateral step down 6 inch 2 x 10   [facility] Adult PT Treatment:                                                DATE: 05/12/23  Therapeutic Exercise: Upright bike L3: 5 min (mild R knee pain, 3/10) Standing quad stretch x 20 sec each  Recumbent bike L3 x 1.5 min (no pain)  LF leg press 35#,  2 x 10 LF knee ext 15# x 3 (R knee pain, 2/10) LF calf ext 10# x 10, 20# x 10  LF hip abdct (seated) 55# 3 x 10    04/30/23 Step up 6 inch 1 x 10 Lateral step down 4 inch 2 x 10  Squat 2 x 10 Lateral stepping 5 x 8 feet  04/23/23 -treadmill 2. x 5.5 min for warm up - standing quad stretch x 20s x 2 each LE - sidelying hip abdct, 2 x 10 RLE, 1 x 10 - fig 4 bridge, 5 sec hold x 10 each LE - long sitting SLR-> abdct/addct 3 x 5 ,good challenge for RLE - standing mini squat with opp leg slide into abdct/ ext x 5 each side  - quadruped (knees  on pillow on mat table) with LE ext x 3 each side (mild increase in pain with pressure on knees)  04/16/23 -Standing quad stretch x 20s each  -Rock tape application to R ant knee  - forward step up/step down, retro step up/down x 8 RLE, x 5 LLE (no increase in pain) - sidelying R hip abdct x 10, clams x 10, hip abdct with 3 pulses x 10 (req rest after 7) - SLR x 10 slow - Fig 4 bridge with 5 sec hold in ext    PATIENT EDUCATION:  Education details: exercise rationale and technique  3/29: HEP, reassessment findings, POC, general exercise Person educated: Patient Education method: Explanation, Demonstration,  Education comprehension: verbalized understanding, returned demonstration, verbal cues required, and tactile cues required  HOME EXERCISE PROGRAM: Access Code: [email or web address] URL: [email or web address]  ASSESSMENT:  CLINICAL IMPRESSION: Patient has met 2/2 short term goals and 5/6 long term goals with ability to complete HEP and improvement in symptoms, strength, motor control, activity tolerance, and functional mobility. Remaining goals not met due to continued deficits in symptoms, activity, tolerance, functional mobility. Patient has made good progress toward remaining goal and was 1% away from predicted score. Patient educated as listed above. Patient d/c from PT.      From initial evaluation: Patient a 53 y.o. y.o. male who was seen today for physical therapy evaluation and treatment for bilateral knee pain. Patient presents with pain limited deficits in bilateral knee strength, ROM, endurance, activity tolerance, and functional mobility with ADL. Patient is having to modify and restrict ADL as indicated by outcome measure score as well as subjective information and objective measures which is affecting overall participation. Patient will benefit from skilled physical therapy in order to improve function and reduce impairment.  OBJECTIVE IMPAIRMENTS: decreased activity  tolerance, decreased mobility, difficulty walking, decreased ROM, decreased strength, improper body mechanics, and pain.   ACTIVITY LIMITATIONS: carrying, lifting, bending, squatting, stairs, transfers, locomotion level, and caring for others  PARTICIPATION LIMITATIONS: meal prep, cleaning, laundry, shopping, community activity, occupation, and yard work  PERSONAL FACTORS: Fitness and Time since onset of injury/illness/exacerbation are also affecting patient's functional outcome.   REHAB POTENTIAL: Good  CLINICAL DECISION MAKING: Stable/uncomplicated  EVALUATION COMPLEXITY: Low   GOALS: Goals reviewed with patient? Yes  SHORT TERM GOALS: Target date: 04/17/2023    Patient will be independent with HEP in order to improve functional outcomes. Baseline: Goal status:MET  2.  Patient will report at least 25% improvement in symptoms for improved quality of life. Baseline:60%  Goal status:MET - 05/12/23   LONG TERM GOALS: Target date: 05/15/2023    Patient will report at least 75% improvement in symptoms for improved quality of life. Baseline: 85-90%  Goal status: MET  2.  Patient will improve FOTO score to predicted outcomes in order to indicate improved tolerance to activity. Baseline: 59% function 06/20/23: 70% function - predicted 71% function Goal status: In progress - see above   3.  Patient will be able to navigate stairs with reciprocal pattern without compensation in order to demonstrate improved LE strength. Baseline:  Goal status: MET  4.  Patient will be able to perform forward step down test without deviation in order to demonstrate improved LE strength and motor control.  Baseline:  Goal status:MET  5.  Patient will be able to complete 5x STS in under 11.4 seconds in order to reduce the risk of falls. Baseline:  Goal status: MET  6.  Patient will demonstrate grade of 5/5 MMT grade in all tested musculature as evidence of improved strength to assist with stair  ambulation and gait.   Baseline:  Goal status: MET   PLAN:  PT FREQUENCY: 1x/week  PT DURATION: 4 weeks  PLANNED INTERVENTIONS: [id number]- PT Re-evaluation, 97110-Therapeutic exercises, 97530- Therapeutic activity, 97112- Neuromuscular re-education, 97535- Self Care, 47829- Manual therapy, [phone]- Gait training, [phone]- Orthotic Fit/training, [phone]- Canalith repositioning, [id number]- Aquatic Therapy, [phone]- Splinting, Patient/Family education, Balance training, Stair training, Taping, Dry Needling, Joint mobilization, Joint manipulation, Spinal manipulation, Spinal mobilization, Scar mobilization, and DME instructions.  PLAN FOR NEXT SESSION: n/a    Wyman Songster, PT 06/20/2023, 12:06 PM  [facility] [address] [place], [place], [address] Phone: [phone]   Fax:  [phone]

## 2023-07-23 ENCOUNTER — Encounter: Payer: 59 | Admitting: Medical

## 2023-09-30 ENCOUNTER — Encounter: Payer: Self-pay | Admitting: Medical

## 2023-09-30 ENCOUNTER — Ambulatory Visit: Admitting: Medical

## 2023-09-30 VITALS — BP 130/80 | HR 92 | Ht 68.0 in | Wt 187.2 lb

## 2023-09-30 DIAGNOSIS — I10 Essential (primary) hypertension: Secondary | ICD-10-CM | POA: Diagnosis not present

## 2023-09-30 DIAGNOSIS — Z0000 Encounter for general adult medical examination without abnormal findings: Secondary | ICD-10-CM

## 2023-09-30 DIAGNOSIS — I8393 Asymptomatic varicose veins of bilateral lower extremities: Secondary | ICD-10-CM

## 2023-09-30 DIAGNOSIS — Z23 Encounter for immunization: Secondary | ICD-10-CM

## 2023-09-30 DIAGNOSIS — R7301 Impaired fasting glucose: Secondary | ICD-10-CM | POA: Diagnosis not present

## 2023-09-30 DIAGNOSIS — Z125 Encounter for screening for malignant neoplasm of prostate: Secondary | ICD-10-CM

## 2023-09-30 DIAGNOSIS — E559 Vitamin D deficiency, unspecified: Secondary | ICD-10-CM | POA: Diagnosis not present

## 2023-09-30 DIAGNOSIS — Z13220 Encounter for screening for lipoid disorders: Secondary | ICD-10-CM

## 2023-09-30 LAB — LIPID PANEL

## 2023-09-30 NOTE — Progress Notes (Signed)
 Subjective:   HPI  Derek Cowan is a 53 y.o. male who presents for Chief Complaint  Patient presents with   Annual Exam    Fasting cpe, has neuropathy in legs- burning and tingling- wearing compression hoses and having more pain around ankles, bottom of feet look discolored.  Has dermatology appt in January 2026. June 21st and June 27th had really bad migraines, sometimes has had right hand trembling just for a few seconds multiple times.     Patient Care Team: Nasim Garofano, Alm GORMAN RIGGERS as PCP - General (Family Medicine) Elna Ahmed SQUIBB, PA-C as Physician Assistant (Physician Assistant) Sees dentist Sees eye doctor Dr. Elsie Mussel and Dr. Ozell Dopp, orthopedics Dermatology   Concerns: Ongoing issues with legs.  He is seeing vascular surgery and cardiology and dermatology in the past year for his ongoing leg issues.  He is compliant with prescription compression hose.  He is exercising rarely.  He has several different skin care treatments he uses on the legs  His aches are typically superficial in the right leg but they can be intermittent in different parts of his lower leg.  Not the entire leg and no numbness or tingling.  No back pain.   Reviewed their medical, surgical, family, social, medication, and allergy history and updated chart as appropriate.  Past Medical History:  Diagnosis Date   Eczema    Impaired fasting blood sugar    Vitamin D  deficiency     Past Surgical History:  Procedure Laterality Date   HAND SURGERY  2021   tumor of left hand, benign    Family History  Problem Relation Age of Onset   Lupus Mother    Lung disease Mother        ARDS   Hypertension Mother    Diabetes Father    Prostatitis Father    Arrhythmia Sister    Miscarriages / Stillbirths Sister    Hypertension Sister    Heart disease Paternal Aunt    Cancer Paternal Grandfather        lung   Stroke Neg Hx      Current Outpatient Medications:    betamethasone  valerate  ointment (VALISONE ) 0.1 %, Apply 1 Application topically 2 (two) times daily., Disp: 45 g, Rfl: 0   cholecalciferol (VITAMIN D3) 25 MCG (1000 UNIT) tablet, Take 1 tablet (1,000 Units total) by mouth daily., Disp: 90 tablet, Rfl: 3   clobetasol  ointment (TEMOVATE ) 0.05 %, Apply 1 Application topically 2 (two) times daily., Disp: 45 g, Rfl: 0   fluocinonide cream (LIDEX) 0.05 %, Apply topically., Disp: , Rfl:    losartan (COZAAR) 25 MG tablet, Take 25 mg by mouth daily., Disp: , Rfl:    omeprazole  (PRILOSEC) 40 MG capsule, Take 1 capsule (40 mg total) by mouth daily., Disp: 30 capsule, Rfl: 0   terazosin  (HYTRIN ) 2 MG capsule, Take 1 capsule (2 mg total) by mouth at bedtime., Disp: 90 capsule, Rfl: 1  Allergies  Allergen Reactions   Lactose Intolerance (Gi)     Review of Systems  Constitutional:  Negative for chills, fever, malaise/fatigue and weight loss.  HENT:  Negative for congestion, ear pain, hearing loss, sore throat and tinnitus.   Eyes:  Negative for blurred vision, pain and redness.  Respiratory:  Negative for cough, hemoptysis and shortness of breath.   Cardiovascular:  Negative for chest pain, palpitations, orthopnea, claudication and leg swelling.  Gastrointestinal:  Negative for abdominal pain, blood in stool, constipation, diarrhea, nausea and vomiting.  Genitourinary:  Negative for dysuria, flank pain, frequency, hematuria and urgency.  Musculoskeletal:  Positive for myalgias. Negative for falls and joint pain.  Skin:  Positive for rash. Negative for itching.  Neurological:  Negative for dizziness, tingling, speech change, weakness and headaches.  Endo/Heme/Allergies:  Negative for polydipsia. Does not bruise/bleed easily.  Psychiatric/Behavioral:  Negative for depression and memory loss. The patient is not nervous/anxious and does not have insomnia.         09/30/2023    3:42 PM 06/30/2022    2:11 PM 02/12/2022    2:40 PM 05/29/2021   12:14 PM 11/20/2020    9:32 AM   Depression screen PHQ 2/9  Decreased Interest 0 0 0 0 0  Down, Depressed, Hopeless 0 0 0 0 0  PHQ - 2 Score 0 0 0 0 0        Objective:  BP 130/80   Pulse 92   Ht 5' 8 (1.727 m)   Wt 187 lb 3.2 oz (84.9 kg)   SpO2 99%   BMI 28.46 kg/m   BP Readings from Last 3 Encounters:  09/30/23 130/80  06/15/23 (!) 133/90  06/10/23 136/86   Wt Readings from Last 3 Encounters:  09/30/23 187 lb 3.2 oz (84.9 kg)  06/15/23 194 lb 4.8 oz (88.1 kg)  06/10/23 191 lb 6.4 oz (86.8 kg)    General appearance: alert, no distress, WD/WN, African American male Skin: No obvious worrisome lesions today HEENT: normocephalic, conjunctiva/corneas normal, sclerae anicteric, PERRLA, EOMi, nares patent, no discharge or erythema, pharynx normal Oral cavity: MMM, tongue normal, teeth normal Neck: supple, no lymphadenopathy, no thyromegaly, no masses, normal ROM, no bruits Chest: non tender, normal shape and expansion Heart: RRR, normal S1, S2, no murmurs Lungs: CTA bilaterally, no wheezes, rhonchi, or rales Abdomen: +bs, soft, non tender, non distended, no masses, no hepatomegaly, no splenomegaly, no bruits Back: non tender, normal ROM, no scoliosis Musculoskeletal: upper extremities non tender, no obvious deformity, normal ROM throughout, lower extremities non tender, no obvious deformity, normal ROM throughout Extremities: Moderate varicose veins of lower legs but worse on the right, no edema, no cyanosis, no clubbing Pulses: 2+ symmetric, upper and lower extremities, normal cap refill Neurological: alert, oriented x 3, CN2-12 intact, strength normal upper extremities and lower extremities, sensation normal throughout, DTRs 2+ throughout, no cerebellar signs, gait normal Psychiatric: normal affect, behavior normal, pleasant  GU: normal male external genitalia,circumcised, nontender, no masses, no hernia, no lymphadenopathy Rectal: Anus normal tone, prostate moderately enlarged, no nodules   Assessment  and Plan :   Encounter Diagnoses  Name Primary?   Encounter for health maintenance examination in adult Yes   Essential hypertension, benign    Impaired fasting blood sugar    Vitamin D  deficiency    Varicose veins of both lower extremities, unspecified whether complicated    Screening for prostate cancer    Screening for lipid disorders    Need for pneumococcal 20-valent conjugate vaccination       This visit was a preventative care visit, also known as wellness visit or routine physical.   Topics typically include healthy lifestyle, diet, exercise, preventative care, vaccinations, sick and well care, proper use of emergency dept and after hours care, as well as other concerns.     Recommendations: Continue to return yearly for your annual wellness and preventative care visits.  This gives us  a chance to discuss healthy lifestyle, exercise, vaccinations, review your chart record, and perform screenings where appropriate.  I  recommend you see your eye doctor yearly for routine vision care.  I recommend you see your dentist yearly for routine dental care including hygiene visits twice yearly.   Vaccination recommendations were reviewed Immunization History  Administered Date(s) Administered   Hepatitis B, ADULT 11/10/2014   Influenza,inj,Quad PF,6+ Mos 11/18/2019, 11/20/2020   Influenza-Unspecified 12/24/2021   Moderna Covid-19 Vaccine Bivalent Booster 81yrs & up 12/24/2021   PFIZER(Purple Top)SARS-COV-2 Vaccination 05/27/2019, 06/17/2019, 02/15/2020, 11/07/2020   PNEUMOCOCCAL CONJUGATE-20 09/30/2023   Tdap 11/08/2014, 11/18/2019   Zoster Recombinant(Shingrix ) 09/05/2022, 11/14/2022   Consider Prevnar 20 pneumococcal vaccine, consider yearly flu shot  Counseled on the pneumococcal vaccine.  Vaccine information sheet given.  Pneumococcal vaccine Prevnar 20 given after consent obtained.   Screening for cancer: Colon cancer screening: 02/2021 Cologuard negative.   Due  02/2024.  We discussed PSA, prostate exam, and prostate cancer screening risks/benefits.   PSA lab today  Skin cancer screening: Check your skin regularly for new changes, growing lesions, or other lesions of concern Come in for evaluation if you have skin lesions of concern.  Lung cancer screening: If you have a greater than 20 pack year history of tobacco use, then you may qualify for lung cancer screening with a chest CT scan.   Please call your insurance company to inquire about coverage for this test.  We currently don't have screenings for other cancers besides breast, cervical, colon, and lung cancers.  If you have a strong family history of cancer or have other cancer screening concerns, please let me know.    Bone health: Get at least 150 minutes of aerobic exercise weekly Get weight bearing exercise at least once weekly Bone density test:  A bone density test is an imaging test that uses a type of X-ray to measure the amount of calcium and other minerals in your bones. The test may be used to diagnose or screen you for a condition that causes weak or thin bones (osteoporosis), predict your risk for a broken bone (fracture), or determine how well your osteoporosis treatment is working. The bone density test is recommended for females 65 and older, or females or males <65 if certain risk factors such as thyroid disease, long term use of steroids such as for asthma or rheumatological issues, vitamin D  deficiency, estrogen deficiency, family history of osteoporosis, self or family history of fragility fracture in first degree relative.    Heart health: Get at least 150 minutes of aerobic exercise weekly Limit alcohol It is important to maintain a healthy blood pressure and healthy cholesterol numbers  Heart disease screening: Screening for heart disease includes screening for blood pressure, fasting lipids, glucose/diabetes screening, BMI height to weight ratio, reviewed of  smoking status, physical activity, and diet.    Goals include blood pressure 120/80 or less, maintaining a healthy lipid/cholesterol profile, preventing diabetes or keeping diabetes numbers under good control, not smoking or using tobacco products, exercising most days per week or at least 150 minutes per week of exercise, and eating healthy variety of fruits and vegetables, healthy oils, and avoiding unhealthy food choices like fried food, fast food, high sugar and high cholesterol foods.    Other tests may possibly include EKG test, CT coronary calcium score, echocardiogram, exercise treadmill stress test.    Medical care options: I recommend you continue to seek care here first for routine care.  We try really hard to have available appointments Monday through Friday daytime hours for sick visits, acute visits, and physicals.  Urgent  care should be used for after hours and weekends for significant issues that cannot wait till the next day.  The emergency department should be used for significant potentially life-threatening emergencies.  The emergency department is expensive, can often have long wait times for less significant concerns, so try to utilize primary care, urgent care, or telemedicine when possible to avoid unnecessary trips to the emergency department.  Virtual visits and telemedicine have been introduced since the pandemic started in 2020, and can be convenient ways to receive medical care.  We offer virtual appointments as well to assist you in a variety of options to seek medical care.   Separate significant issues discussed: Impaired fasting glucose -updated labs today  Varicose veins -continue compression hose, regular exercise, and add 81 mg aspirin daily.  Continue your skin treatments per dermatology  Leg pain-multifactorial.  Symptoms still suggest more of a varicose vein, venous insufficiency issue versus something else.  I reviewed notes from where he has seen vascular and  cardiology in the past year.  He has good pulses.  No obvious need to see orthopedics or pain management, but.  We discussed possibly pursuing 1 of those if his symptoms persist    Willett was seen today for annual exam.  Diagnoses and all orders for this visit:  Encounter for health maintenance examination in adult -     CBC -     Comprehensive metabolic panel with GFR -     Lipid panel -     PSA -     Hemoglobin A1c -     Vitamin B12  Essential hypertension, benign -     Comprehensive metabolic panel with GFR  Impaired fasting blood sugar -     Hemoglobin A1c  Vitamin D  deficiency  Varicose veins of both lower extremities, unspecified whether complicated -     Comprehensive metabolic panel with GFR -     Vitamin B12  Screening for prostate cancer -     PSA  Screening for lipid disorders -     Lipid panel  Need for pneumococcal 20-valent conjugate vaccination -     Pneumococcal conjugate vaccine 20-valent (Prevnar 20)    Follow-up pending labs, yearly for physical

## 2023-10-01 ENCOUNTER — Ambulatory Visit: Payer: Self-pay

## 2023-10-01 ENCOUNTER — Other Ambulatory Visit: Payer: Self-pay | Admitting: Medical

## 2023-10-01 ENCOUNTER — Ambulatory Visit: Payer: Self-pay | Admitting: Medical

## 2023-10-01 DIAGNOSIS — I10 Essential (primary) hypertension: Secondary | ICD-10-CM

## 2023-10-01 LAB — COMPREHENSIVE METABOLIC PANEL WITH GFR
ALT: 11 IU/L (ref 0–44)
AST: 17 IU/L (ref 0–40)
Albumin: 4.9 g/dL (ref 3.8–4.9)
Alkaline Phosphatase: 92 IU/L (ref 44–121)
BUN/Creatinine Ratio: 15 (ref 9–20)
BUN: 20 mg/dL (ref 6–24)
Bilirubin Total: 1 mg/dL (ref 0.0–1.2)
CO2: 22 mmol/L (ref 20–29)
Calcium: 10 mg/dL (ref 8.7–10.2)
Chloride: 97 mmol/L (ref 96–106)
Creatinine, Ser: 1.33 mg/dL — AB (ref 0.76–1.27)
Globulin, Total: 2.8 g/dL (ref 1.5–4.5)
Glucose: 87 mg/dL (ref 70–99)
Potassium: 4.3 mmol/L (ref 3.5–5.2)
Sodium: 137 mmol/L (ref 134–144)
Total Protein: 7.7 g/dL (ref 6.0–8.5)
eGFR: 65 mL/min/1.73 (ref >59)

## 2023-10-01 LAB — CBC
Hematocrit: 46.1 % (ref 37.5–51.0)
Hemoglobin: 15.3 g/dL (ref 13.0–17.7)
MCH: 29.1 pg (ref 26.6–33.0)
MCHC: 33.2 g/dL (ref 31.5–35.7)
MCV: 88 fL (ref 79–97)
Platelets: 242 x10E3/uL (ref 150–450)
RBC: 5.26 x10E6/uL (ref 4.14–5.80)
RDW: 12.9 % (ref 11.6–15.4)
WBC: 6.6 x10E3/uL (ref 3.4–10.8)

## 2023-10-01 LAB — PSA: Prostate Specific Ag, Serum: 1.8 ng/mL (ref 0.0–4.0)

## 2023-10-01 LAB — VITAMIN B12: Vitamin B-12: 519 pg/mL (ref 232–1245)

## 2023-10-01 LAB — LIPID PANEL
Cholesterol, Total: 196 mg/dL (ref 100–199)
HDL: 63 mg/dL (ref >39)
LDL CALC COMMENT:: 3.1 ratio (ref 0.0–5.0)
LDL Chol Calc (NIH): 123 mg/dL — AB (ref 0–99)
Triglycerides: 53 mg/dL (ref 0–149)
VLDL Cholesterol Cal: 10 mg/dL (ref 5–40)

## 2023-10-01 LAB — HEMOGLOBIN A1C
Est. average glucose Bld gHb Est-mCnc: 117 mg/dL
Hgb A1c MFr Bld: 5.7 — AB (ref 4.8–5.6)

## 2023-10-01 MED ORDER — VITAMIN D 25 MCG (1000 UNIT) PO TABS: 1000.0000 [IU] | ORAL_TABLET | Freq: Every day | ORAL | 3 refills | Status: AC

## 2023-10-01 MED ORDER — TERAZOSIN HCL 2 MG PO CAPS: 2.0000 mg | ORAL_CAPSULE | Freq: Every day | ORAL | 2 refills | Status: DC

## 2023-10-01 NOTE — Telephone Encounter (Signed)
 FYI Only or Action Required?: Action required by provider: update on patient condition.  Patient was last seen in primary care on 09/30/2023 by Bulah Alm RAMAN, PA-C.  Called Nurse Triage reporting Follow-Up.  Symptoms began several days ago.  Interventions attempted: Nothing.  Symptoms are: unchanged.  Triage Disposition: Call PCP Within 24 Hours  Patient/caregiver understands and will follow disposition?: Yes     Copied from CRM [id number]. Topic: Clinical - Red Word Triage >> Oct 01, 2023  1:55 PM Charlet HERO wrote: Red Word that prompted transfer to Nurse Triage: Patient is calling about burning sensation around his ankle since 7 this morning his right ankle. He saw Dr. Cesar and he offered meds via my chart patient was not able to reply and wants to get those meds called in. Reason for Disposition  [1] Follow-up call from patient regarding patient's clinical status AND [2] information NON-URGENT  Answer Assessment - Initial Assessment Questions 1. REASON FOR CALL or QUESTION: What is your reason for calling today? or How can I best     Patient would like to get started on one of the medications that he and provider were discussing in Mychart this morning. (Gabapentin, Amtryptilline, or Requip). Patient says that he responded but hadnt heard back.   2. CALLER: Document the source of call. (e.g., laboratory staff, caregiver or patient).     Patient  Protocols used: PCP Call - No Triage-A-AH

## 2023-10-01 NOTE — Progress Notes (Signed)
Results sent through mychart 

## 2023-10-02 ENCOUNTER — Other Ambulatory Visit: Payer: Self-pay | Admitting: Medical

## 2023-10-02 MED ORDER — GABAPENTIN 100 MG PO CAPS: 100.0000 mg | ORAL_CAPSULE | Freq: Two times a day (BID) | ORAL | 0 refills | Status: DC

## 2023-11-06 ENCOUNTER — Other Ambulatory Visit: Payer: Self-pay | Admitting: Medical

## 2023-11-06 NOTE — Telephone Encounter (Signed)
 Last apt 09/30/23

## 2023-12-01 ENCOUNTER — Other Ambulatory Visit: Payer: Self-pay | Admitting: Medical

## 2023-12-01 DIAGNOSIS — I10 Essential (primary) hypertension: Secondary | ICD-10-CM

## 2023-12-01 MED ORDER — TERAZOSIN HCL 2 MG PO CAPS
2.0000 mg | ORAL_CAPSULE | Freq: Every day | ORAL | 2 refills | Status: AC
Start: 2023-12-01 — End: ?

## 2023-12-01 NOTE — Telephone Encounter (Signed)
 Copied from CRM [id number]. Topic: Clinical - Medication Refill >> Dec 01, 2023  2:35 PM Fonda T wrote: Medication: terazosin  (HYTRIN ) 2 MG capsule  Needs medication refilled as soon as possible, only has 1 pill left.  Has the patient contacted their pharmacy? Yes, advised to contact office   This is the patient's preferred pharmacy:  [facility] [address] [place], [place] - [address] [place] [address] Phone: [phone] Fax: [phone]  Is this the correct pharmacy for this prescription? Yes If no, delete pharmacy and type the correct one.   Has the prescription been filled recently? Yes  Is the patient out of the medication? Yes, has 1 pill left   Has the patient been seen for an appointment in the last year OR does the patient have an upcoming appointment? Yes  Can we respond through MyChart? No, prefers phone call at [phone]    Agent: Please be advised that Rx refills may take up to 3 business days. We ask that you follow-up with your pharmacy.

## 2023-12-28 ENCOUNTER — Other Ambulatory Visit: Payer: Self-pay | Admitting: Medical

## 2024-04-07 ENCOUNTER — Ambulatory Visit: Admitting: Medical

## 2024-04-07 ENCOUNTER — Encounter: Payer: Self-pay | Admitting: Medical

## 2024-04-07 VITALS — BP 130/82 | HR 92 | Temp 97.4°F | Wt 198.4 lb

## 2024-04-07 DIAGNOSIS — I10 Essential (primary) hypertension: Secondary | ICD-10-CM

## 2024-04-07 DIAGNOSIS — Z823 Family history of stroke: Secondary | ICD-10-CM

## 2024-04-07 DIAGNOSIS — Z136 Encounter for screening for cardiovascular disorders: Secondary | ICD-10-CM

## 2024-04-07 DIAGNOSIS — Z1211 Encounter for screening for malignant neoplasm of colon: Secondary | ICD-10-CM | POA: Diagnosis not present

## 2024-04-07 DIAGNOSIS — R7301 Impaired fasting glucose: Secondary | ICD-10-CM | POA: Diagnosis not present

## 2024-04-07 DIAGNOSIS — E7800 Pure hypercholesterolemia, unspecified: Secondary | ICD-10-CM

## 2024-04-07 LAB — LIPID PANEL

## 2024-04-07 MED ORDER — ATENOLOL 25 MG PO TABS: 25.0000 mg | ORAL_TABLET | Freq: Every day | ORAL | 1 refills | Status: AC

## 2024-04-07 NOTE — Progress Notes (Signed)
 "  Name: Derek Cowan   Date of Visit: 04/07/24   Date of last visit with me: 12/28/2023   CHIEF COMPLAINT:  Chief Complaint  Patient presents with   Acute Visit    Having episodes like 4-5 times a year. Headache since new years, head tender, pressure, bp was elevated. Went to dermatology on Monday and bp was 156/92. Today is first day he actually feels normal today       HPI:  Discussed the use of AI scribe software for clinical note transcription with the patient, who gave verbal consent to proceed.  History of Present Illness   Derek Cowan is a 54 year old male with hypertension who presents with blood pressure spikes and headaches.  He experiences recurrent episodes of blood pressure spikes, approximately three times a year, which have become more prolonged. The most recent episode began around Christmas and persists. During these episodes, he has insomnia, frequent urination, and headaches with tenderness and shooting pains on the left side of his temple. Blood pressure readings during these episodes are typically around 150-160 mmHg.  He started taking terazosin  last year for HTN and BPH. He reports that he no longer experiences frequent nighttime urination or significant dribbling after urination. His current medication differs from what his family members take.  He is concerned about his family history, as his father recently had a stroke. His family members are also on blood pressure medication, but at lower doses than his own.  He has a history of prediabetes and elevated cholesterol levels. He recalls a recent blood pressure reading of 156/92 mmHg during a visit to another doctor. He also has a history of childhood asthma, with no recent issues except during a COVID-19 infection.  He has not been exercising regularly due to cold weather and holiday disruptions but intends to resume physical activity. No current asthma symptoms.  No other aggravating or relieving  factors. No other complaint.  Past Medical History:  Diagnosis Date   Eczema    Impaired fasting blood sugar    Vitamin D  deficiency    Medications Ordered Prior to Encounter[1]     Objective: BP 130/82   Pulse 92   Temp (!) 97.4 F (36.3 C)   Wt 198 lb 6.4 oz (90 kg)   BMI 30.17 kg/m   Wt Readings from Last 3 Encounters:  04/07/24 198 lb 6.4 oz (90 kg)  09/30/23 187 lb 3.2 oz (84.9 kg)  06/15/23 194 lb 4.8 oz (88.1 kg)    General appearence: alert, no distress, WD/WN,  HEENT: normocephalic, sclerae anicteric, PERRLA, EOMi Neck: supple, no lymphadenopathy, no thyromegaly, no masses, no bruits Heart: RRR, normal S1, S2, no murmurs Lungs: CTA bilaterally, no wheezes, rhonchi, or rales Extremities: no edema, no cyanosis, no clubbing Pulses: 2+ symmetric, upper and lower extremities, normal cap refill Neurological: alert, oriented x 3, CN2-12 intact, strength normal upper extremities and lower extremities, sensation normal throughout, DTRs 2+ throughout, no cerebellar signs, gait normal Psychiatric: normal affect, behavior normal, pleasant      Assessment: Encounter Diagnoses  Name Primary?   Essential hypertension, benign Yes   Screening for heart disease    Impaired fasting blood sugar    Family history of stroke    Elevated LDL cholesterol level    Screening for colon cancer      Plan:  Essential hypertension Intermittent blood pressure spikes with headaches. Current medication ineffective. Considered atenolol  due to elevated pulse on multiple prior occasions, and headaches. Discussed  atenolol  side effects. MRI deferred pending insurance. - Initiated atenolol  25 mg once daily. - Continue Tterazosin for BPH and HTN - Monitor blood pressure at home; report if systolic <105 mmHg. - Consider MRI/MRA if headaches persist; consult insurance. - Follow-up in one month to assess control and symptoms.  Hypercholesterolemia Cholesterol levels not at goal. Discussed  CT coronary calcium scan. Consider cholesterol medication if LDL >130 mg/dL. - Ordered CT coronary calcium scan. - Ordered cholesterol panel. - Consider cholesterol medication if LDL >130 mg/dL.  Impaired fasting glucose Borderline glucose levels. Discussed dietary modifications. - Ordered A1c test. - Advised dietary modifications.  Benign prostatic hyperplasia with lower urinary tract symptoms Symptoms include nocturia and post-void dribbling. Hydrea (terazosin ) provides some benefit. - Continue Terazosin   General health maintenance Discussed colon cancer screening with Cologuard. Previous order not completed. - Ordered Cologuard for colon cancer screening.    Derek Cowan was seen today for acute visit.  Diagnoses and all orders for this visit:  Essential hypertension, benign  Screening for heart disease -     CT CARDIAC SCORING (SELF PAY ONLY); Future  Impaired fasting blood sugar -     Hemoglobin A1c  Family history of stroke  Elevated LDL cholesterol level -     Lipid panel  Screening for colon cancer -     Cologuard  Other orders -     atenolol  (TENORMIN ) 25 MG tablet; Take 1 tablet (25 mg total) by mouth daily.    F/u pending labs      [1]  Current Outpatient Medications on File Prior to Visit  Medication Sig Dispense Refill   cholecalciferol (VITAMIN D3) 25 MCG (1000 UNIT) tablet Take 1 tablet (1,000 Units total) by mouth daily. 90 tablet 3   clindamycin (CLINDAGEL) 1 % gel Apply 1 Application topically 2 (two) times daily.     hydrocortisone 2.5 % cream Apply 1 Application topically 2 (two) times daily.     terazosin  (HYTRIN ) 2 MG capsule Take 1 capsule (2 mg total) by mouth at bedtime. 90 capsule 2   tretinoin (RETIN-A) 0.025 % cream Apply topically at bedtime.     triamcinolone cream (KENALOG) 0.1 % Apply 1 Application topically 2 (two) times daily.     gabapentin  (NEURONTIN ) 100 MG capsule Take 1 capsule by mouth twice daily (Patient not taking:  Reported on 04/07/2024) 60 capsule 0   No current facility-administered medications on file prior to visit.   "

## 2024-04-08 ENCOUNTER — Telehealth: Payer: Self-pay | Admitting: Internal Medicine

## 2024-04-08 ENCOUNTER — Other Ambulatory Visit: Payer: Self-pay | Admitting: Medical

## 2024-04-08 ENCOUNTER — Ambulatory Visit: Payer: Self-pay | Admitting: Medical

## 2024-04-08 LAB — HEMOGLOBIN A1C
Est. average glucose Bld gHb Est-mCnc: 123 mg/dL
Hgb A1c MFr Bld: 5.9 % — ABNORMAL HIGH (ref 4.8–5.6)

## 2024-04-08 LAB — LIPID PANEL
Cholesterol, Total: 187 mg/dL (ref 100–199)
HDL: 54 mg/dL
LDL CALC COMMENT:: 3.5 ratio (ref 0.0–5.0)
LDL Chol Calc (NIH): 117 mg/dL — AB (ref 0–99)
Triglycerides: 87 mg/dL (ref 0–149)
VLDL Cholesterol Cal: 16 mg/dL (ref 5–40)

## 2024-04-08 MED ORDER — GABAPENTIN 100 MG PO CAPS: 100.0000 mg | ORAL_CAPSULE | Freq: Two times a day (BID) | ORAL | 0 refills | Status: AC

## 2024-04-08 NOTE — Telephone Encounter (Signed)
 Copied from CRM [id number]. Topic: Clinical - Medication Refill >> Apr 08, 2024 11:56 AM Alfonso HERO wrote: Medication: gabapentin  (NEURONTIN ) 100 MG capsule  Has the patient contacted their pharmacy? Yes (Agent: If no, request that the patient contact the pharmacy for the refill. If patient does not wish to contact the pharmacy document the reason why and proceed with request.) (Agent: If yes, when and what did the pharmacy advise?)  This is the patient's preferred pharmacy:  [facility] [address] [place], [place] - [address] [place] [address] Phone: [phone] Fax: [phone]  Is this the correct pharmacy for this prescription? Yes If no, delete pharmacy and type the correct one.   Has the prescription been filled recently? Yes  Is the patient out of the medication? Yes  Has the patient been seen for an appointment in the last year OR does the patient have an upcoming appointment? Yes  Can we respond through MyChart? Yes  Agent: Please be advised that Rx refills may take up to 3 business days. We ask that you follow-up with your pharmacy.

## 2024-04-08 NOTE — Progress Notes (Signed)
 Results through MyChart

## 2024-04-15 ENCOUNTER — Ambulatory Visit
Admission: RE | Admit: 2024-04-15 | Discharge: 2024-04-15 | Disposition: A | Payer: Self-pay | Source: Ambulatory Visit | Attending: Medical | Admitting: Medical

## 2024-04-15 DIAGNOSIS — Z136 Encounter for screening for cardiovascular disorders: Secondary | ICD-10-CM | POA: Insufficient documentation

## 2024-04-17 NOTE — Progress Notes (Signed)
 Results thru my chart

## 2024-05-05 ENCOUNTER — Ambulatory Visit: Admitting: Medical

## 2024-10-10 ENCOUNTER — Encounter: Payer: Self-pay | Admitting: Medical
